# Patient Record
Sex: Female | Born: 1990 | Race: Black or African American | Hispanic: No | Marital: Single | Smoking: Never smoker
Health system: Southern US, Community
[De-identification: ages and names within clinical notes are randomized; demographics above are authoritative.]

## PROBLEM LIST (undated history)

## (undated) DIAGNOSIS — R87619 Unspecified abnormal cytological findings in specimens from cervix uteri: Secondary | ICD-10-CM

## (undated) DIAGNOSIS — R768 Other specified abnormal immunological findings in serum: Secondary | ICD-10-CM

## (undated) DIAGNOSIS — Z8619 Personal history of other infectious and parasitic diseases: Secondary | ICD-10-CM

## (undated) DIAGNOSIS — A749 Chlamydial infection, unspecified: Secondary | ICD-10-CM

## (undated) DIAGNOSIS — Z87898 Personal history of other specified conditions: Secondary | ICD-10-CM

## (undated) DIAGNOSIS — Z86711 Personal history of pulmonary embolism: Secondary | ICD-10-CM

## (undated) DIAGNOSIS — Z86718 Personal history of other venous thrombosis and embolism: Secondary | ICD-10-CM

## (undated) DIAGNOSIS — O0991 Supervision of high risk pregnancy, unspecified, first trimester: Secondary | ICD-10-CM

## (undated) DIAGNOSIS — I2699 Other pulmonary embolism without acute cor pulmonale: Secondary | ICD-10-CM

## (undated) DIAGNOSIS — R87629 Unspecified abnormal cytological findings in specimens from vagina: Secondary | ICD-10-CM

## (undated) DIAGNOSIS — Z8249 Family history of ischemic heart disease and other diseases of the circulatory system: Secondary | ICD-10-CM

## (undated) DIAGNOSIS — IMO0002 Reserved for concepts with insufficient information to code with codable children: Secondary | ICD-10-CM

## (undated) DIAGNOSIS — Z309 Encounter for contraceptive management, unspecified: Secondary | ICD-10-CM

## (undated) HISTORY — DX: Encounter for contraceptive management, unspecified: Z30.9

## (undated) HISTORY — DX: Other pulmonary embolism without acute cor pulmonale: I26.99

## (undated) HISTORY — DX: Personal history of other specified conditions: Z87.898

## (undated) HISTORY — DX: Supervision of high risk pregnancy, unspecified, first trimester: O09.91

## (undated) HISTORY — DX: Personal history of pulmonary embolism: Z86.711

## (undated) HISTORY — DX: Family history of ischemic heart disease and other diseases of the circulatory system: Z82.49

## (undated) HISTORY — DX: Reserved for concepts with insufficient information to code with codable children: IMO0002

## (undated) HISTORY — DX: Unspecified abnormal cytological findings in specimens from vagina: R87.629

## (undated) HISTORY — DX: Personal history of other infectious and parasitic diseases: Z86.19

## (undated) HISTORY — DX: Chlamydial infection, unspecified: A74.9

## (undated) HISTORY — DX: Unspecified abnormal cytological findings in specimens from cervix uteri: R87.619

## (undated) HISTORY — PX: INDUCED ABORTION: SHX677

---
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
## 2011-03-10 LAB — RPR: RPR Ser Ql: NONREACTIVE

## 2011-03-10 LAB — CBC
MCHC: 33.5 g/dL (ref 30.0–36.0)
Platelets: 208 10*3/uL (ref 150–400)
RDW: 15.9 % — ABNORMAL HIGH (ref 11.5–15.5)

## 2011-04-29 ENCOUNTER — Inpatient Hospital Stay (HOSPITAL_COMMUNITY)
Admission: AD | Admit: 2011-04-29 | Discharge: 2011-05-05 | DRG: 175 | Disposition: A | Discharge status: Home / Self Care | Payer: Medicaid Other | Source: Other Acute Inpatient Hospital | Attending: Internal Medicine | Admitting: Internal Medicine

## 2011-04-29 DIAGNOSIS — Z66 Do not resuscitate: Secondary | ICD-10-CM | POA: Diagnosis present

## 2011-04-29 DIAGNOSIS — Z7901 Long term (current) use of anticoagulants: Secondary | ICD-10-CM

## 2011-04-29 DIAGNOSIS — I2699 Other pulmonary embolism without acute cor pulmonale: Principal | ICD-10-CM | POA: Diagnosis present

## 2011-04-29 DIAGNOSIS — R071 Chest pain on breathing: Secondary | ICD-10-CM | POA: Diagnosis present

## 2011-04-29 DIAGNOSIS — Z87891 Personal history of nicotine dependence: Secondary | ICD-10-CM

## 2011-04-29 DIAGNOSIS — Z79899 Other long term (current) drug therapy: Secondary | ICD-10-CM

## 2011-04-29 DIAGNOSIS — R0602 Shortness of breath: Secondary | ICD-10-CM

## 2011-04-29 DIAGNOSIS — J189 Pneumonia, unspecified organism: Secondary | ICD-10-CM | POA: Diagnosis present

## 2011-04-29 DIAGNOSIS — D649 Anemia, unspecified: Secondary | ICD-10-CM | POA: Diagnosis present

## 2011-04-30 LAB — CBC
MCHC: 35.6 g/dL (ref 30.0–36.0)
Platelets: 275 10*3/uL (ref 150–400)
RDW: 13.7 % (ref 11.5–15.5)
WBC: 11.7 10*3/uL — ABNORMAL HIGH (ref 4.0–10.5)

## 2011-04-30 LAB — URINALYSIS, ROUTINE W REFLEX MICROSCOPIC
Bilirubin Urine: NEGATIVE
Glucose, UA: NEGATIVE mg/dL
Ketones, ur: 40 mg/dL — AB
Protein, ur: NEGATIVE mg/dL

## 2011-04-30 LAB — PROTIME-INR: INR: 1.13 (ref 0.00–1.49)

## 2011-04-30 LAB — BASIC METABOLIC PANEL
BUN: 8 mg/dL (ref 6–23)
Calcium: 9.3 mg/dL (ref 8.4–10.5)
Creatinine, Ser: 0.62 mg/dL (ref 0.4–1.2)
GFR calc Af Amer: >60 mL/min (ref >60)

## 2011-04-30 LAB — ANTITHROMBIN III: AntiThromb III Func: 102 % (ref 75–120)

## 2011-04-30 NOTE — H&P (Signed)
[name], [name]               ACCOUNT NO.:  [id number]  MEDICAL RECORD NO.:  [id number]           PATIENT TYPE:  I  LOCATION:  2021                         FACILITY:  [facility]  PHYSICIAN:  Hillery Aldo, M.D.   DATE OF BIRTH:  10-25-1991  DATE OF ADMISSION:  04/29/2011 DATE OF DISCHARGE:                             HISTORY & PHYSICAL   PRIMARY CARE PHYSICIAN:  Mila Homer. Sudie Bailey, MD  CHIEF COMPLAINT:  Back pain, dyspnea.  HISTORY OF PRESENT ILLNESS:  This is a 20 year old female who was admitted to [facility] earlier today with complaints of back pain and dyspnea.  The patient had an elevated D-dimer done on her initial workup and subsequently underwent CT scanning of the chest, which confirmed bilateral pulmonary emboli.  She subsequently was admitted to [facility] and family requested transfer to [facility].  The patient arrived by [facility] this evening and is currently reporting some pleuritic-type back and chest pain.  She is mildly dyspneic, but otherwise reports no presyncope-type symptoms, dizziness, or other symptoms.  She did have an episode of nausea and vomiting earlier today, but this has subsequently resolved.  PAST MEDICAL HISTORY:  None.  PAST SURGICAL HISTORY:  None.  FAMILY HISTORY:  The patient's father is 53 years old and has a history of VTE.  The patient's mother is 74 and healthy.  She has 2 healthy siblings.  SOCIAL HISTORY:  The patient is single and lives alone.  She has a history of social tobacco use, but quit over 5 years ago.  She denies alcohol and drug use.  She works as a Pharmacologist at [employer].  ALLERGIES:  No known drug allergies.  MEDICATIONS:  None.  REVIEW OF SYSTEMS:  CONSTITUTIONAL:  No fever or chills.  Appetite is normal.  HEENT:  No complaints.  CARDIOVASCULAR:  Positive for pleuritic- type chest pain.  No palpitations.  RESPIRATORY:  Positive for dyspnea, but no cough.  GI:  Positive for one episode  of nausea and vomiting.  No diarrhea, melena, or hematochezia.  GU:  No dysuria or hematuria. MUSCULOSKELETAL:  No other complaints.  Comprehensive 14-point review of systems is otherwise unremarkable.  PHYSICAL EXAMINATION:  VITAL SIGNS:  Temperature 97.9, pulse 88, respirations 18, blood pressure 101/68, O2 saturation 99% on 2 L. GENERAL:  Well-developed, well-nourished female in no acute distress. HEENT:  Normocephalic, atraumatic.  PERRL.  EOMI.  Oropharynx is clear. NECK:  Supple, no thyromegaly, no lymphadenopathy, no jugular venous distention. CHEST:  Diminished breath sounds.  Clear bilaterally. HEART:  Regular rate, rhythm.  No murmurs, rubs, or gallops. ABDOMEN:  Soft, nontender, nondistended with normoactive bowel sounds. EXTREMITIES:  No clubbing, edema, or cyanosis. SKIN:  Warm and dry.  No rashes. NEUROLOGIC:  The patient is alert and oriented x3.  Cranial nerves II through XII grossly intact.  Nonfocal.  DATA REVIEW:  D-dimer was elevated at 3.91.  Sodium was 137, potassium 3.6, chloride 106, bicarb 25, BUN 9, creatinine 0.80.  Urinalysis had a large amount of bacteria, but was a contaminated specimen with 5-10 epithelial cells.  Nitrites were positive.  Urine pregnancy testing was  negative.  PT was 13.3 with an INR of 1.0.  PTT was 35.8.  White blood cell count was 10.4, hemoglobin 12.8, hematocrit 38.1, platelets 273.  RADIOGRAPHIC DATA:  Currently not available for my review, but reportedly the patient has had CT scan confirming bilateral pulmonary emboli and lower extremity Dopplers, which were negative for DVT.  ASSESSMENT/PLAN: 1. Acute pulmonary emboli:  Given the patient's positive family     history, I suspect she does have a hypercoagulable disorder.  Since     she has been given heparin, we cannot order a full panel, but we     will check a homocystine, factor V Leiden, and antithrombin III     level.  Protein C and protein S deficiency testing cannot be  done     at this time.  We will admit the patient for 23-hour observation to     initiate Lovenox, Coumadin, and to teach the patient how to self-     inject Lovenox. 2. Bacteriuria:  This was a contaminated specimen.  Since the patient     is asymptomatic, we would not treat this at this time.  She was     given a dose of Rocephin at [facility], but would not     continue this.  Time spent on admission including face-to-face time equals approximately 45 minutes.     Hillery Aldo, M.D.     CR/MEDQ  D:  04/29/2011  T:  04/30/2011  Job:  [id number]  cc:   Mila Homer. Sudie Bailey, M.D.  Electronically Signed by Hillery Aldo M.D. on 04/30/2011 12:06:27 PM

## 2011-05-01 LAB — CBC
HCT: 34.9 % — ABNORMAL LOW (ref 36.0–46.0)
MCH: 27.7 pg (ref 26.0–34.0)
MCHC: 35 g/dL (ref 30.0–36.0)
MCV: 79.1 fL (ref 78.0–100.0)
Platelets: 259 10*3/uL (ref 150–400)
RDW: 13.5 % (ref 11.5–15.5)
WBC: 11.2 10*3/uL — ABNORMAL HIGH (ref 4.0–10.5)

## 2011-05-01 LAB — DIFFERENTIAL
Eosinophils Absolute: 0 10*3/uL (ref 0.0–0.7)
Eosinophils Relative: 0 % (ref 0–5)
Lymphocytes Relative: 20 % (ref 12–46)
Lymphs Abs: 2.2 10*3/uL (ref 0.7–4.0)
Monocytes Absolute: 1.7 10*3/uL — ABNORMAL HIGH (ref 0.1–1.0)

## 2011-05-01 LAB — BASIC METABOLIC PANEL
BUN: 8 mg/dL (ref 6–23)
CO2: 30 mEq/L (ref 19–32)
Calcium: 9.6 mg/dL (ref 8.4–10.5)
Chloride: 98 mEq/L (ref 96–112)
Creatinine, Ser: 0.65 mg/dL (ref 0.4–1.2)
GFR calc Af Amer: >60 mL/min (ref >60)
Glucose, Bld: 98 mg/dL (ref 70–99)

## 2011-05-02 ENCOUNTER — Inpatient Hospital Stay (HOSPITAL_COMMUNITY): Payer: Medicaid Other

## 2011-05-02 ENCOUNTER — Encounter (HOSPITAL_COMMUNITY): Payer: Self-pay | Admitting: Radiology

## 2011-05-02 LAB — PROTIME-INR: Prothrombin Time: 19.7 seconds — ABNORMAL HIGH (ref 11.6–15.2)

## 2011-05-02 LAB — CBC
MCH: 27.2 pg (ref 26.0–34.0)
MCHC: 34.5 g/dL (ref 30.0–36.0)
MCV: 78.8 fL (ref 78.0–100.0)
Platelets: 282 10*3/uL (ref 150–400)
RBC: 4.3 MIL/uL (ref 3.87–5.11)
RDW: 13.3 % (ref 11.5–15.5)

## 2011-05-02 LAB — BASIC METABOLIC PANEL
BUN: 9 mg/dL (ref 6–23)
CO2: 31 mEq/L (ref 19–32)
Calcium: 9.3 mg/dL (ref 8.4–10.5)
GFR calc non Af Amer: >60 mL/min (ref >60)
Glucose, Bld: 104 mg/dL — ABNORMAL HIGH (ref 70–99)
Sodium: 135 mEq/L (ref 135–145)

## 2011-05-02 MED ORDER — IOHEXOL 300 MG/ML  SOLN: 100.0000 mL | Freq: Once | INTRAMUSCULAR | Status: AC | PRN

## 2011-05-03 LAB — CBC
Hemoglobin: 11.5 g/dL — ABNORMAL LOW (ref 12.0–15.0)
MCH: 27.3 pg (ref 26.0–34.0)
MCHC: 34.6 g/dL (ref 30.0–36.0)
RDW: 13.4 % (ref 11.5–15.5)

## 2011-05-03 LAB — PROTIME-INR: Prothrombin Time: 24.9 seconds — ABNORMAL HIGH (ref 11.6–15.2)

## 2011-05-04 ENCOUNTER — Inpatient Hospital Stay (HOSPITAL_COMMUNITY): Payer: Medicaid Other

## 2011-05-04 LAB — CBC
HCT: 32.6 % — ABNORMAL LOW (ref 36.0–46.0)
Hemoglobin: 11.2 g/dL — ABNORMAL LOW (ref 12.0–15.0)
MCH: 27.1 pg (ref 26.0–34.0)
MCHC: 34.4 g/dL (ref 30.0–36.0)
RDW: 13.6 % (ref 11.5–15.5)

## 2011-05-04 LAB — PROTIME-INR: Prothrombin Time: 31.7 seconds — ABNORMAL HIGH (ref 11.6–15.2)

## 2011-05-04 LAB — BASIC METABOLIC PANEL
BUN: 6 mg/dL (ref 6–23)
CO2: 29 mEq/L (ref 19–32)
GFR calc non Af Amer: >60 mL/min (ref >60)
Glucose, Bld: 82 mg/dL (ref 70–99)
Potassium: 3.5 mEq/L (ref 3.5–5.1)

## 2011-05-06 LAB — FACTOR 5 LEIDEN

## 2011-05-18 NOTE — Discharge Summary (Signed)
[name], [name]               ACCOUNT NO.:  [id number]  MEDICAL RECORD NO.:  [id number]           PATIENT TYPE:  I  LOCATION:  2021                         FACILITY:  [facility]  PHYSICIAN:  Lonia Blood, M.D.       DATE OF BIRTH:  [birth year]  DATE OF ADMISSION:  04/29/2011 DATE OF DISCHARGE:  05/05/2011                              DISCHARGE SUMMARY   PRIMARY CARE PHYSICIAN:  Mila Homer. Sudie Bailey, MD  DISCHARGE DIAGNOSES: 1. Bilateral pulmonary emboli with right lower lobe pulmonary     infarction with mild parapneumonic effusion. 2. Mild anemia. 3. DNR/DNI. 4. Probable pneumonia.  DISCHARGE MEDICATIONS: 1. Coumadin 5 mg daily to be further adjusted by the primary care     physician when she visits the office on May 06, 2011. 2. Colace 100 mg twice daily for constipation. 3. Oxycodone 5 mg every 4 hours needed for severe pain. 4. Ibuprofen 400 mg three times a day for 5 more days to be titrated     down based on symptoms by the primary care physician. 5. Avelox 400 grams daily for 3 more days.  CONDITION ON DISCHARGE:  [name] was discharged in fair condition. She was improved.  She was still having some mild to moderate residual pleuritic chest pain.  She was not hypoxic.  Her oxygen saturation was 96% on room air.  She was instructed about energy conservation minimal activity.  She received education about the Coumadin diet, and she is going to follow up with Dr. John Giovanni on May 06, 2011, at 1:00 p.m. for PT/INR checks and hospital followup visit.  PROCEDURE THIS ADMISSION: 1. The patient underwent a chest CT with contrast May 02, 2011,     showing right lower lobe infiltrate/pneumonia with parapneumonic     effusion, pleural parenchymal thickening of the left lung base     small infarct atelectasis. 2. May 04, 2011 stable right lower lobe infiltrate with a small     parapneumonic effusion.  CONSULTATION:  No consultations obtained.  HISTORY AND PHYSICAL:   Refer to dictated H and P done by Dr. Hillery Aldo.  Briefly, [name] is a 20 year old woman without any significant past medical history presented to the [facility] where she was found to have bilateral pulmonary emboli. She was transferred to [facility] for further workup and observation.  At [facility], [name] was started on subcutaneous Lovenox and oral Coumadin.  She also received intravenous fluids.  She was never hypoxic.  By hospital #2, she was complaining bitterly of right-sided chest pain.  At that point in time, we ordered a chest x-ray which confirmed presence of right lower lobe infiltrate with adjacent effusion.  She underwent a chest CT which did not show any hemothorax in fact showed a consolidation of right lower lobe which was probably lung infarct and superimposed pneumonia with a small parapneumonic effusion.  The patient was continued on Lovenox and Coumadin and Avelox intravenously was added.  Slowly her chest pain improved her fever resolved, and she gradually got better.  By May 05, 2011, she already  had 5 days of overlapping treatment between Lovenox and Coumadin.  Her INR was therapeutic at 2.9 for 2 consecutive days so Lovenox was discontinued and she was discharge on oral Coumadin.  Our plan of care for the outpatient setting is to continue antibiotics for 3 more days, nonsteroidal inflammatory drugs as long as she has pleuritic chest pain and then anticoagulate with Coumadin for probably 2 years and then discontinue with careful plans to monitor for recurrence of the pulmonary emboli.     Lonia Blood, M.D.     SL/MEDQ  D:  05/06/2011  T:  05/06/2011  Job:  [id number]  cc:   Mila Homer. Sudie Bailey, M.D.  Electronically Signed by Lonia Blood M.D. on 05/18/2011 09:37:05 AM

## 2011-06-08 ENCOUNTER — Encounter (HOSPITAL_COMMUNITY): Payer: Medicaid Other | Attending: Oncology | Admitting: Oncology

## 2011-06-08 DIAGNOSIS — I2699 Other pulmonary embolism without acute cor pulmonale: Secondary | ICD-10-CM

## 2012-02-29 ENCOUNTER — Emergency Department (INDEPENDENT_AMBULATORY_CARE_PROVIDER_SITE_OTHER): Payer: Medicaid Other

## 2012-02-29 ENCOUNTER — Encounter (HOSPITAL_BASED_OUTPATIENT_CLINIC_OR_DEPARTMENT_OTHER): Payer: Self-pay | Admitting: *Deleted

## 2012-02-29 ENCOUNTER — Emergency Department (HOSPITAL_BASED_OUTPATIENT_CLINIC_OR_DEPARTMENT_OTHER)
Admission: EM | Admit: 2012-02-29 | Discharge: 2012-02-29 | Disposition: A | Discharge status: Home Health | Payer: Medicaid Other | Attending: Emergency Medicine | Admitting: Emergency Medicine

## 2012-02-29 ENCOUNTER — Other Ambulatory Visit: Payer: Self-pay

## 2012-02-29 DIAGNOSIS — Z331 Pregnant state, incidental: Secondary | ICD-10-CM | POA: Insufficient documentation

## 2012-02-29 DIAGNOSIS — M545 Low back pain, unspecified: Secondary | ICD-10-CM | POA: Insufficient documentation

## 2012-02-29 DIAGNOSIS — R079 Chest pain, unspecified: Secondary | ICD-10-CM

## 2012-02-29 DIAGNOSIS — R109 Unspecified abdominal pain: Secondary | ICD-10-CM | POA: Insufficient documentation

## 2012-02-29 DIAGNOSIS — Z86718 Personal history of other venous thrombosis and embolism: Secondary | ICD-10-CM | POA: Insufficient documentation

## 2012-02-29 DIAGNOSIS — Z86711 Personal history of pulmonary embolism: Secondary | ICD-10-CM

## 2012-02-29 HISTORY — DX: Personal history of other venous thrombosis and embolism: Z86.718

## 2012-02-29 LAB — BASIC METABOLIC PANEL
CO2: 24 mEq/L (ref 19–32)
Chloride: 104 mEq/L (ref 96–112)
Creatinine, Ser: 0.6 mg/dL (ref 0.50–1.10)
GFR calc Af Amer: >90 mL/min (ref >90)
Potassium: 3.7 mEq/L (ref 3.5–5.1)
Sodium: 136 mEq/L (ref 135–145)

## 2012-02-29 LAB — CBC
HCT: 33.3 % — ABNORMAL LOW (ref 36.0–46.0)
MCHC: 36 g/dL (ref 30.0–36.0)
Platelets: 287 10*3/uL (ref 150–400)
RDW: 13.7 % (ref 11.5–15.5)
WBC: 10.1 10*3/uL (ref 4.0–10.5)

## 2012-02-29 LAB — HCG, QUANTITATIVE, PREGNANCY: hCG, Beta Chain, Quant, S: 165879 m[IU]/mL — ABNORMAL HIGH (ref <5)

## 2012-02-29 LAB — PROTIME-INR
INR: 1.08 (ref 0.00–1.49)
Prothrombin Time: 14.2 seconds (ref 11.6–15.2)

## 2012-02-29 LAB — PREGNANCY, URINE: Preg Test, Ur: POSITIVE — AB

## 2012-02-29 LAB — DIFFERENTIAL
Basophils Absolute: 0 10*3/uL (ref 0.0–0.1)
Basophils Relative: 0 % (ref 0–1)
Lymphocytes Relative: 19 % (ref 12–46)
Neutro Abs: 7.2 10*3/uL (ref 1.7–7.7)
Neutrophils Relative %: 71 % (ref 43–77)

## 2012-02-29 MED ORDER — IOHEXOL 350 MG/ML SOLN
80.0000 mL | Freq: Once | INTRAVENOUS | Status: AC | PRN
  Administered 2012-02-29: 80 mL via INTRAVENOUS

## 2012-02-29 NOTE — ED Notes (Signed)
Chest pain x 2 days back pain today. Hx of blood clots in her lungs about a year ago. Denies sob.

## 2012-02-29 NOTE — ED Provider Notes (Signed)
History  This chart was scribed for Tracy Shi, MD by Bennett Scrape. This patient was seen in room MH02/MH02 and the patient's care was started at 3:37PM.  CSN: [id number]  Arrival date & time 02/29/12  1505   First MD Initiated Contact with Patient 02/29/12 1531      Chief Complaint  Patient presents with  . Chest Pain    The history is provided by the patient. No language interpreter was used.    [name] is a 21 y.o. female with a h/o blood clots who presents to the Emergency Department complaining of 2 days of gradual onset, non-changing, constant chest pain and 6 hours of gradual onset, gradually worsening, consatnt right flank and lower back pain. The chest pain is described as a dull ache and soreness feeling. The right flank and lower back pain is described as a sharp pain that is worse with deep breathes. Pt has not taken any medication PTA to improve symptoms. She reports having diagnosed PEs last April/May in both lungs and wanted to be evaluated today to make sure that it wasn't another blood clot. The symptoms are similar to the symptoms she experienced last year. Since a cause was not determined last year, she states that she was told to take Coumadin for one year and then the proper genetic tests would be run to determine why she was having blood clots. She denies being on birth control as the cause of the blood clots. She reports a family h/o blood clots with her father. She states that she has been taking coumadin everyday but not within the same time frame everyday. She also states that she has not taken her coumadin for the past 2 days due to her new job schedule. She has not taken it today yet. She denies hemoptysis, SOB, nausea, emesis, cough, fever, congestion, HA and leg swelling as associated symptoms. She has no other h/o chronic medical problems. She denies smoking and alcohol use.  Past Medical History  Diagnosis Date  . Hx of blood clots     History  reviewed. No pertinent past surgical history.  Family History: Hx of blood clots with Father  History  Substance Use Topics  . Smoking status: Never Smoker   . Smokeless tobacco: Not on file  . Alcohol Use: No    Review of Systems  A complete 10 system review of systems was obtained and is otherwise negative except as noted in the HPI.   Allergies  Review of patient's allergies indicates no known allergies.  Home Medications   Current Outpatient Rx  Name Route Sig Dispense Refill  . WARFARIN SODIUM 5 MG PO TABS Oral Take 5 mg by mouth daily. Patient takes 1.5 mg of this medication daily.      Triage Vitals: BP 123/86  Pulse 76  Temp(Src) 97.4 F (36.3 C) (Oral)  Resp 16  SpO2 98%  LMP 01/01/2012  Physical Exam  Nursing note and vitals reviewed. Constitutional: She is oriented to person, place, and time. She appears well-developed and well-nourished. No distress.  HENT:  Head: Normocephalic and atraumatic.  Eyes: Conjunctivae and EOM are normal. Pupils are equal, round, and reactive to light.  Neck: Normal range of motion. Neck supple.  Cardiovascular: Normal rate, regular rhythm and intact distal pulses.         Date: 02/29/2012  Rate: 77  Rhythm: normal sinus rhythm with PACs  QRS Axis: normal  Intervals: normal  ST/T Wave abnormalities: normal  Conduction  Disutrbances: none  Narrative Interpretation: unremarkable      Pulmonary/Chest: Effort normal and breath sounds normal. No respiratory distress.  Abdominal: Soft. Normal appearance. She exhibits no distension.  Musculoskeletal: Normal range of motion. She exhibits tenderness (Mild right flank and lower back tenderness). She exhibits no edema.  Neurological: She is alert and oriented to person, place, and time. No cranial nerve deficit.  Skin: Skin is warm and dry. No rash noted.  Psychiatric: She has a normal mood and affect. Her behavior is normal.    ED Course  Procedures (including critical care  time) Laceration of the pelvis with an ultrasound showed an intrauterine pregnancy with a fetal pole probably 5 or [redacted] weeks gestational age with some cardiac activity.  The patient stated that she was 99.9% sure that she was not going to keep the pregnancy and would terminate it.  A lot of this fact I will proceed as if the patient was not pregnant I discussed this with her along with the complications of both radiation and anticoagulation to the developing fetus and the patient was agreeable to that treatment plan because she said she was not going to keep the pregnancy. DIAGNOSTIC STUDIES: Oxygen Saturation is 98% on room air, normal by my interpretation.    COORDINATION OF CARE: 3:40PM-Informed pt of positive pregnancy test and pt acknowledged results. Discussed treatment plan with pt and pt agreed to plan.  Labs Reviewed  PREGNANCY, URINE - Abnormal; Notable for the following:    Preg Test, Ur POSITIVE (*)    All other components within normal limits  HCG, QUANTITATIVE, PREGNANCY - Abnormal; Notable for the following:    hCG, Beta Chain, [place]alia Longest [id number] (*)    All other components within normal limits  CBC - Abnormal; Notable for the following:    HCT 33.3 (*)    MCV 77.6 (*)    All other components within normal limits  PROTIME-INR  DIFFERENTIAL  BASIC METABOLIC PANEL    Ct Angio Chest W/cm &/or Wo Cm  02/29/2012  *RADIOLOGY REPORT*  Clinical Data: 21 year old female with chest pain.  History of pulmonary emboli.  CT ANGIOGRAPHY CHEST  Technique:  Multidetector CT imaging of the chest using the standard protocol during bolus administration of intravenous contrast. Multiplanar reconstructed images including MIPs were obtained and reviewed to evaluate the vascular anatomy. The patient's abdomen was shielded given the patient's positive pregnancy test.  Contrast: 80mL OMNIPAQUE IOHEXOL 350 MG/ML IV SOLN  Comparison: 05/02/2011 CT  Findings: This is a technically satisfactory study.  No  pulmonary emboli are identified. There is no evidence of thoracic aortic aneurysm or definite dissection.  The heart and great vessels are within normal limits.  Minimal right basilar scarring is noted. The lungs are otherwise clear. There is no evidence of airspace disease, consolidation, nodules/mass or endobronchial/endotracheal lesion.  No acute or suspicious bony abnormalities are identified. Portions of the visualized upper abdomen are unremarkable.  IMPRESSION: No evidence of acute abnormality - no evidence of pulmonary emboli or aortic aneurysm/definite dissection.  Mild right basilar scarring.  Original Report Authenticated By: Rosendo Gros, M.D.     1. Chest pain   2. IUP (intrauterine pregnancy), incidental       MDM  I discussed the findings of the CT scan with the patient.  The patient is going to terminate the pregnancy and has decided not to start back anticoagulants until termination is complete.  After that she will contact her hematologist and discuss further workup  for determination of long-term anticoagulant therapy.      I personally performed the services described in this documentation, which was scribed in my presence. The recorded information has been reviewed and considered.    Tracy Shi, MD 02/29/12 [phone]

## 2012-02-29 NOTE — ED Notes (Signed)
MD at bedside for bedside ultrasound.

## 2012-02-29 NOTE — Discharge Instructions (Signed)
Chest Pain (Nonspecific) It is often hard to give a specific diagnosis for the cause of chest pain. There is always a chance that your pain could be related to something serious, such as a heart attack or a blood clot in the lungs. You need to follow up with your caregiver for further evaluation. CAUSES   Heartburn.   Pneumonia or bronchitis.   Anxiety or stress.   Inflammation around your heart (pericarditis) or lung (pleuritis or pleurisy).   A blood clot in the lung.   A collapsed lung (pneumothorax). It can develop suddenly on its own (spontaneous pneumothorax) or from injury (trauma) to the chest.   Shingles infection (herpes zoster virus).  The chest wall is composed of bones, muscles, and cartilage. Any of these can be the source of the pain.  The bones can be bruised by injury.   The muscles or cartilage can be strained by coughing or overwork.   The cartilage can be affected by inflammation and become sore (costochondritis).  DIAGNOSIS  Lab tests or other studies, such as X-rays, electrocardiography, stress testing, or cardiac imaging, may be needed to find the cause of your pain.  TREATMENT   Treatment depends on what may be causing your chest pain. Treatment may include:   Acid blockers for heartburn.   Anti-inflammatory medicine.   Pain medicine for inflammatory conditions.   Antibiotics if an infection is present.   You may be advised to change lifestyle habits. This includes stopping smoking and avoiding alcohol, caffeine, and chocolate.   You may be advised to keep your head raised (elevated) when sleeping. This reduces the chance of acid going backward from your stomach into your esophagus.   Most of the time, nonspecific chest pain will improve within 2 to 3 days with rest and mild pain medicine.  HOME CARE INSTRUCTIONS   If antibiotics were prescribed, take your antibiotics as directed. Finish them even if you start to feel better.   For the next few  days, avoid physical activities that bring on chest pain. Continue physical activities as directed.   Do not smoke.   Avoid drinking alcohol.   Only take over-the-counter or prescription medicine for pain, discomfort, or fever as directed by your caregiver.   Follow your caregiver's suggestions for further testing if your chest pain does not go away.   Keep any follow-up appointments you made. If you do not go to an appointment, you could develop lasting (chronic) problems with pain. If there is any problem keeping an appointment, you must call to reschedule.  SEEK MEDICAL CARE IF:   You think you are having problems from the medicine you are taking. Read your medicine instructions carefully.   Your chest pain does not go away, even after treatment.   You develop a rash with blisters on your chest.  SEEK IMMEDIATE MEDICAL CARE IF:   You have increased chest pain or pain that spreads to your arm, neck, jaw, back, or abdomen.   You develop shortness of breath, an increasing cough, or you are coughing up blood.   You have severe back or abdominal pain, feel nauseous, or vomit.   You develop severe weakness, fainting, or chills.   You have a fever.  THIS IS AN EMERGENCY. Do not wait to see if the pain will go away. Get medical help at once. Call your local emergency services (911 in [place] Do not drive yourself to the hospital. MAKE SURE YOU:   Understand these instructions.     Will watch your condition.   Will get help right away if you are not doing well or get worse.  Document Released: 09/02/2005 Document Revised: 11/12/2011 Document Reviewed: 06/28/2008 [employer] Patient Information 2012 [employer]. 

## 2012-02-29 NOTE — ED Notes (Signed)
MD at bedside. 

## 2012-05-30 ENCOUNTER — Ambulatory Visit (HOSPITAL_COMMUNITY): Payer: Medicaid Other | Admitting: Oncology

## 2012-06-14 ENCOUNTER — Ambulatory Visit (HOSPITAL_COMMUNITY): Payer: Medicaid Other | Admitting: Oncology

## 2012-08-09 ENCOUNTER — Encounter (HOSPITAL_COMMUNITY): Payer: Self-pay | Admitting: Oncology

## 2012-08-09 ENCOUNTER — Encounter (HOSPITAL_COMMUNITY): Payer: Medicaid Other | Attending: Oncology | Admitting: Oncology

## 2012-08-09 VITALS — BP 109/65 | HR 94 | Temp 98.6°F | Resp 16

## 2012-08-09 DIAGNOSIS — I2699 Other pulmonary embolism without acute cor pulmonale: Secondary | ICD-10-CM

## 2012-08-09 DIAGNOSIS — Z09 Encounter for follow-up examination after completed treatment for conditions other than malignant neoplasm: Secondary | ICD-10-CM | POA: Insufficient documentation

## 2012-08-09 DIAGNOSIS — Z86711 Personal history of pulmonary embolism: Secondary | ICD-10-CM | POA: Insufficient documentation

## 2012-08-09 NOTE — Patient Instructions (Addendum)
[name]  DOB September 15, 1991 CSN [id number]  MRN [id number] Dr. Glenford Peers   [facility]  Discharge Instructions  RECOMMENDATIONS MADE BY THE CONSULTANT AND ANY TEST RESULTS WILL BE SENT TO YOUR REFERRING DOCTOR.   EXAM FINDINGS BY MD TODAY AND SIGNS AND SYMPTOMS TO REPORT TO CLINIC OR PRIMARY MD: you are doing well.  We will check a hypercoagulable panel today.  Call us in 2 weeks for results.  MEDICATIONS PRESCRIBED: none   INSTRUCTIONS GIVEN AND DISCUSSED: Other :  If you have sudden chest pain, shortness of breath, swelling of extremity - go to nearest ED then let us know.  SPECIAL INSTRUCTIONS/FOLLOW-UP: Lab work Needed today and Return to Clinic in 6 months.   I acknowledge that I have been informed and understand all the instructions given to me and received a copy. I do not have any more questions at this time, but understand that I may call the Specialty Clinic at [facility] at [phone] during business hours should I have any further questions or need assistance in obtaining follow-up care.    __________________________________________  _____________  __________ Signature of Patient or Authorized Representative            Date                   Time    __________________________________________ Nurse's Signature

## 2012-08-09 NOTE — Progress Notes (Signed)
Problem #1 pulmonary embolus in May 2012 with infarction of the right lower lobe and with pains in her lower legs presumably the site of origin of the blood clots. Her only risk factor that we could come up with was the use of the Mirena IUD. She however had one family member namely her father who had a pulmonary embolus and is being treated by my associate Dr. Myna Hidalgo. Her father  did not have a clear-cut predisposing factor. She has been doing very well, asymptomatic, and is here today for routine followup. She finished her 12 full months of Coumadin anticoagulation. We therefore we will do a hypercoagulable panel in its entirety. And she will call us back for the results. She would like to be on something for birth control the signs condoms and other measures. She does however want to have more children someday in the future.  I want to see her back in 6 months either way.

## 2012-08-09 NOTE — Progress Notes (Signed)
[name] presented for labwork. Labs per MD order drawn via Peripheral Line 23 gauge needle inserted in right AC  Good blood return present. Procedure without incident.  Needle removed intact. Patient tolerated procedure well.   

## 2012-08-10 LAB — PROTEIN C ACTIVITY: Protein C Activity: 104 % (ref 75–133)

## 2012-08-10 LAB — PROTEIN S ACTIVITY: Protein S Activity: 41 % — ABNORMAL LOW (ref 69–129)

## 2012-08-10 LAB — LUPUS ANTICOAGULANT PANEL
DRVVT: 42.5 secs (ref <45.1)
Lupus Anticoagulant: NOT DETECTED
PTT Lupus Anticoagulant: 37.6 secs (ref 28.0–43.0)

## 2012-08-10 LAB — PROTEIN S, TOTAL: Protein S Ag, Total: 67 % (ref 60–150)

## 2012-08-10 LAB — ANTITHROMBIN III: AntiThromb III Func: 77 % (ref 75–120)

## 2012-08-10 LAB — HOMOCYSTEINE: Homocysteine: 4.1 umol/L (ref 4.0–15.4)

## 2012-08-10 LAB — PROTEIN C, TOTAL: Protein C, Total: 56 % — ABNORMAL LOW (ref 72–160)

## 2012-08-11 LAB — BETA-2-GLYCOPROTEIN I ABS, IGG/M/A
Beta-2 Glyco I IgG: 0 G Units (ref <20)
Beta-2-Glycoprotein I IgA: 4 A Units (ref <20)

## 2012-08-11 LAB — CARDIOLIPIN ANTIBODIES, IGG, IGM, IGA: Anticardiolipin IgG: 6 GPL U/mL — ABNORMAL LOW (ref <23)

## 2012-08-12 LAB — PROTHROMBIN GENE MUTATION

## 2012-08-22 ENCOUNTER — Telehealth (HOSPITAL_COMMUNITY): Payer: Self-pay | Admitting: *Deleted

## 2012-08-22 NOTE — Telephone Encounter (Signed)
Pt called requesting lab reports. Please advise as to what to tell her.

## 2012-08-30 ENCOUNTER — Telehealth (HOSPITAL_COMMUNITY): Payer: Self-pay | Admitting: *Deleted

## 2012-08-30 NOTE — Telephone Encounter (Signed)
I talked to [facility] and let her know that from our standpoint she can resume her IUD/birth control. I explained to her that factor v and  PROTHROMBIN II GENE MUTATION were negative. Protein s and protein c were a little low. She is agreeable to have referral made to [facility].

## 2012-09-02 ENCOUNTER — Telehealth (HOSPITAL_COMMUNITY): Payer: Self-pay | Admitting: *Deleted

## 2012-09-02 NOTE — Telephone Encounter (Signed)
Yes, Dr Isaiah Serge or Dr MA ----- Message -----    From: Adelene Amas, RN    Sent: 08/30/2012   4:23 PM      To: Randall An, MD  I talked to [facility] and let her know that from our standpoint she can resume her IUD/birth control. I explained to her that factor v and  PROTHROMBIN II GENE MUTATION were negative. Protein s and protein c were a little low. She is agreeable to have referral made to [facility]. Do you want [place] to proceed with this?  ----- Message -----    From: Adelene Amas, RN    Sent: 08/30/2012  12:16 PM      To: Adelene Amas, RN  Call pt. OK for IUD/birth control.  Would she consider consult at [facility] ---Dr. Isaiah Serge

## 2013-02-06 ENCOUNTER — Encounter (HOSPITAL_COMMUNITY): Payer: Medicaid Other | Attending: Oncology | Admitting: Oncology

## 2013-02-06 ENCOUNTER — Encounter (HOSPITAL_COMMUNITY): Payer: Self-pay | Admitting: Oncology

## 2013-02-06 VITALS — BP 120/70 | HR 63 | Temp 98.1°F | Resp 18 | Wt 175.8 lb

## 2013-02-06 DIAGNOSIS — I2699 Other pulmonary embolism without acute cor pulmonale: Secondary | ICD-10-CM

## 2013-02-06 NOTE — Progress Notes (Signed)
#  1 pulmonary embolus in May 2012 with infarction of the right lower lobe as well as pain in her lower legs presumably the site of origin. She was treated for 12 months with anticoagulation. There was a family history her father a pulmonary embolus him. There no other clearcut predisposing factors. Therefore this was unprovoked PE. Hypercoagulable panel is negative.  I referred her to Dr. Isaiah Serge at [facility]. His note is on its way been included in the EMR. We are requesting another copy. She was under the impression that we were to initiate and coagulation once again but for an unknown specified time.  We will get that note be back in touch with her as soon as we get it. Otherwise we'll tentatively put her down see [place] in 6 months. The one thing I do remember is that he was not at all concerned about the Mirena IUD device as a causative agent.  I have asked her to call us one week from today if she does not hear from [place] before then.

## 2013-02-06 NOTE — Patient Instructions (Addendum)
[facility] Discharge Instructions  RECOMMENDATIONS MADE BY THE CONSULTANT AND ANY TEST RESULTS WILL BE SENT TO YOUR REFERRING PHYSICIAN.  EXAM FINDINGS BY THE PHYSICIAN TODAY AND SIGNS OR SYMPTOMS TO REPORT TO CLINIC OR PRIMARY PHYSICIAN: Discussion by MD regarding xarelto versus coumadin..  We need to get the note from [facility].  Call us on Monday if you don't hear from [place].  MEDICATIONS PRESCRIBED:  none  INSTRUCTIONS GIVEN AND DISCUSSED: Report swelling of extremities, sudden chest pain or shortness of breath (after going to ED).  SPECIAL INSTRUCTIONS/FOLLOW-UP: Follow-up in 6 months  Thank you for choosing [facility] to provide your oncology and hematology care.  To afford each patient quality time with our providers, please arrive at least 15 minutes before your scheduled appointment time.  With your help, our goal is to use those 15 minutes to complete the necessary work-up to ensure our physicians have the information they need to help with your evaluation and healthcare recommendations.    Effective January 1st, 2014, we ask that you re-schedule your appointment with our physicians should you arrive 10 or more minutes late for your appointment.  We strive to give you quality time with our providers, and arriving late affects you and other patients whose appointments are after yours.    Again, thank you for choosing [facility].  Our hope is that these requests will decrease the amount of time that you wait before being seen by our physicians.       _____________________________________________________________  Should you have questions after your visit to [facility], please contact our office at [phone] between the hours of 8:30 a.m. and 5:00 p.m.  Voicemails left after 4:30 p.m. will not be returned until the following business day.  For prescription refill requests, have your pharmacy contact our office with  your prescription refill request.

## 2013-02-08 ENCOUNTER — Encounter (HOSPITAL_COMMUNITY): Payer: Self-pay

## 2013-02-08 ENCOUNTER — Encounter (HOSPITAL_COMMUNITY): Payer: Self-pay | Admitting: Oncology

## 2013-02-08 ENCOUNTER — Telehealth (HOSPITAL_COMMUNITY): Payer: Self-pay | Admitting: Oncology

## 2013-02-08 DIAGNOSIS — Z8249 Family history of ischemic heart disease and other diseases of the circulatory system: Secondary | ICD-10-CM | POA: Insufficient documentation

## 2013-02-08 DIAGNOSIS — I2699 Other pulmonary embolism without acute cor pulmonale: Secondary | ICD-10-CM

## 2013-02-08 DIAGNOSIS — Z86711 Personal history of pulmonary embolism: Secondary | ICD-10-CM | POA: Insufficient documentation

## 2013-02-08 HISTORY — DX: Other pulmonary embolism without acute cor pulmonale: I26.99

## 2013-02-08 HISTORY — DX: Family history of ischemic heart disease and other diseases of the circulatory system: Z82.49

## 2013-02-08 NOTE — Telephone Encounter (Signed)
The patient a voice message on her telephone requesting a telephone call back to the [facility] cancer Center.  We were able to ascertain a note from the [facility] coagulation clinic which recommended Xarelto.  I would like for the patient to call us back so I can provide her with some education regarding the medication.  I received a telephone call back, I will happily prescribe the medication. Xarelto 20 mg daily.  I will await a return telephone call.  KEFALAS,THOMAS

## 2013-03-09 ENCOUNTER — Other Ambulatory Visit (HOSPITAL_COMMUNITY): Payer: Self-pay | Admitting: *Deleted

## 2013-03-09 DIAGNOSIS — I2699 Other pulmonary embolism without acute cor pulmonale: Secondary | ICD-10-CM

## 2013-03-10 ENCOUNTER — Encounter (HOSPITAL_COMMUNITY): Payer: Medicaid Other | Attending: Oncology

## 2013-03-10 DIAGNOSIS — I2699 Other pulmonary embolism without acute cor pulmonale: Secondary | ICD-10-CM | POA: Insufficient documentation

## 2013-03-10 LAB — D-DIMER, QUANTITATIVE: D-Dimer, Quant: 1.23 ug/mL-FEU — ABNORMAL HIGH (ref 0.00–0.48)

## 2013-03-10 NOTE — Progress Notes (Signed)
Labs drawn today for d;dimer,protein s

## 2013-03-13 ENCOUNTER — Other Ambulatory Visit (HOSPITAL_COMMUNITY): Payer: Self-pay

## 2013-03-13 LAB — PROTEIN S ACTIVITY: Protein S Activity: 97 % (ref 69–129)

## 2013-03-14 ENCOUNTER — Encounter (HOSPITAL_COMMUNITY): Payer: Self-pay

## 2013-03-15 ENCOUNTER — Encounter (HOSPITAL_COMMUNITY): Payer: Medicaid Other

## 2013-03-15 NOTE — Progress Notes (Signed)
Labs drawn today for Protein S free

## 2013-03-22 ENCOUNTER — Encounter: Payer: Self-pay | Admitting: *Deleted

## 2013-03-22 DIAGNOSIS — B009 Herpesviral infection, unspecified: Secondary | ICD-10-CM | POA: Insufficient documentation

## 2013-03-22 DIAGNOSIS — A749 Chlamydial infection, unspecified: Secondary | ICD-10-CM | POA: Insufficient documentation

## 2013-03-23 ENCOUNTER — Ambulatory Visit (INDEPENDENT_AMBULATORY_CARE_PROVIDER_SITE_OTHER): Payer: Medicaid Other | Admitting: Obstetrics and Gynecology

## 2013-03-23 ENCOUNTER — Encounter: Payer: Self-pay | Admitting: Obstetrics and Gynecology

## 2013-03-23 VITALS — BP 122/82 | Ht 66.0 in | Wt 172.0 lb

## 2013-03-23 DIAGNOSIS — N898 Other specified noninflammatory disorders of vagina: Secondary | ICD-10-CM

## 2013-03-23 DIAGNOSIS — IMO0002 Reserved for concepts with insufficient information to code with codable children: Secondary | ICD-10-CM | POA: Insufficient documentation

## 2013-03-23 DIAGNOSIS — I2699 Other pulmonary embolism without acute cor pulmonale: Secondary | ICD-10-CM

## 2013-03-23 LAB — POCT WET PREP WITH KOH
Clue Cells Wet Prep HPF POC: NEGATIVE
Trichomonas, UA: NEGATIVE

## 2013-03-23 NOTE — Patient Instructions (Signed)
Please review patient visual guides with partner.Dyspareunia Dyspareunia is pain during sexual intercourse. It is most common in women, but it also happens in men.  CAUSES  Female The pain from this condition is usually felt when anything is put into the vagina, but any part of the genitals may cause pain during sex. Even sitting or wearing pants can cause pain. Sometimes, a cause cannot be found. Some causes of pain during intercourse are:  Infections of the skin around the vagina.  Vaginal infections, such as a yeast, bacterial, or viral infection.  Vaginismus. This is the inability to have anything put in the vagina even when the woman wants it to happen. There is an automatic muscle contraction and pain. The pain of the muscle contraction can be so severe that intercourse is impossible.  Allergic reaction from spermicides, semen, condoms, scented tampons, soaps, douches, and vaginal sprays.  A fluid-filled sac (cyst) on the Bartholin or Skene glands, located at the opening of the vagina.  Scar tissue in the vagina from a surgically enlarged opening (episiotomy) or tearing after delivering a baby.  Vaginal dryness. This is more common in menopause. The normal secretions of the vagina are decreased. Changes in estrogen levels and increased difficulty becoming aroused can cause painful sex. Vaginal dryness can also happen when taking birth control pills.  Thinning of the tissue (atrophy) of the vulva and vagina. This makes the area thinner, smaller, unable to stretch to accommodate a penis, and prone to infection and tearing.  Vulvar vestibulitis or vestibulodynia.This is a condition that causes pain involving the area around the entrance to the vagina.The most common cause in young women is birth control pills.Women with low estrogen levels (postmenopausal women) may also experience this.Other causes include allergic reactions, too many nerve endings, skin conditions, and pelvic muscles  that cannot relax.  Vulvar dermatoses. This includes skin conditions such as lichen sclerosus and lichen planus.  Lack of foreplay to lubricate the vagina. This can cause vaginal dryness.  Noncancerous tumors (fibroids) in the uterus.  Uterus lining tissue growing outside the uterus (endometriosis).  Pregnancy that starts in the fallopian tube (tubal pregnancy).  Pregnancy or breastfeeding your baby. This can cause vaginal dryness.  A tilting or prolapse of the uterus. Prolapse is when weak and stretched muscles around the uterus allow it to fall into the vagina.  Problems with the ovaries, cysts, or scar tissue. This may be worse with certain sexual positions.  Previous surgeries causing adhesions or scar tissue in the vagina or pelvis.  Bladder and intestinal problems.  Psychological problems (such as depression or anxiety). This may make pain worse.  Negative attitudes about sex, experiencing rape, sexual assault, and misinformation about sex. These issues are often related to some types of pain.  Previous pelvic infection, causing scar tissue in the pelvis and on the female organs.  Cyst or tumor on the ovary.  Cancer of the female organs.  Certain medicines.  Medical problems such as diabetes, arthritis, or thyroid disease. Female In men, there are many physical causes of sexual discomfort. Some causes of pain during intercourse are:  Infections of the prostate, bladder, or seminal vesicles. This can cause pain after ejaculation.  An inflamed bladder (interstitial cystitis). This may cause pain from ejaculation.  Gonorrheal infections. This may cause pain during ejaculation.  An inflamed urethra (urethritis) or inflamed prostate (prostatitis). This can make genital stimulation painful or uncomfortable.  Deformities of the penis, such as Peyronie's disease.  A tight foreskin.  Cancer of the female organs.  Psychological problems. This may make pain  worse. DIAGNOSIS   Your caregiver will take a history and have you describe where the pain is located (outside the vagina, in the vagina, in the pelvis). You may be asked when you experience pain, such as with penetration or with thrusting.  Following this, your caregiver will do a physical exam. Let your caregiver know if the exam is too painful.  During the final part of the female exam, your caregiver will feel your uterus and ovaries with one hand on the abdomen and one finger in your vagina. This is a pelvic exam.  Blood tests, a Pap test, cultures for infection, an ultrasound test, and X-rays may be done. You may need to see a specialist for female problems (gynecologist).  Your caregiver may do a CT scan, MRI, or laparoscopy. Laparoscopy is a procedure to look into the pelvis with a lighted tube, through a cut (incision) in the abdomen. TREATMENT  Your caregiver can help you determine the best course of treatment. Sometimes, more testing is done. Continue with the suggested testing until your caregiver feels sure about your diagnosis and how to treat it. Sometimes, it is difficult to find the reason for the pain. The search for the cause and treatment can be frustrating. Treatment often takes several weeks to a few months before you notice any improvement. You may also need to avoid sexual activity until symptoms improve.Continuing to have sex when it hurts can delay healing and actually make the problem worse. The treatment depends on the cause of the pain. Treatment may include:  Medicines such as antibiotics, vaginal or skin creams, hormones, or antidepressants.  Minor or major surgery.  Psychological counseling or group therapy.  Kegel exercises and vaginal dilators to help certain cases of vaginismus (spasms). Do this only if recommended by your caregiver.Kegel exercises can make some problems worse.  Applying lubrication as recommended by your caregiver if you have  dryness.  Sex therapy for you and your sex partner. It is common for the pain to continue after the reason for the pain has been treated. Some reasons for this include a conditioned response. This means the person having the pain becomes so familiar with the pain that the pain continues as a response, even though the cause is removed. Sex therapy can help with this problem. HOME CARE INSTRUCTIONS   Follow your caregiver's instructions about taking medicines, tests, counseling, and follow-up treatment.  Do not use scented tampons, douches, vaginal sprays, or soaps.  Use water-based lubricants for dryness. Oil lubricants can cause irritation.  Do not use spermicides or condoms that irritate you.  Openly discuss with your partner your sexual experience, your desires, foreplay, and different sexual positions for a more comfortable and enjoyable sexual relationship.  Join group sessions for therapy, if needed.  Practice safe sex at all times.  Empty your bladder before having intercourse.  Try different positions during sexual intercourse.  Take over-the-counter pain medicine recommended by your caregiver before having sexual intercourse.  Do not wear pantyhose. Knee-high and thigh-high hose are okay.  Avoid scrubbing your vulva with a washcloth. Wash the area gently and pat dry with a towel. SEEK MEDICAL CARE IF:   You develop vaginal bleeding after sexual intercourse.  You develop a lump at the opening of your vagina, even if it is not painful.  You have abnormal vaginal discharge.  You have vaginal dryness.  You have itching or irritation of  the vulva or vagina.  You develop a rash or reaction to your medicine. SEEK IMMEDIATE MEDICAL CARE IF:   You develop severe abdominal pain during or shortly after sexual intercourse. You could have a ruptured ovarian cyst or ruptured tubal pregnancy.  You have a fever.  You have painful or bloody urination.  You have painful sexual  intercourse, and you never had it before.  You pass out after having sexual intercourse. Document Released: 12/13/2007 Document Revised: 02/15/2012 Document Reviewed: 02/23/2011 [facility] Patient Information 2013 [place], [place].

## 2013-03-26 NOTE — Progress Notes (Signed)
  Assessment:  dyspareunia, uterine retroversion   Plan:  Counselled over anatomy, uterine contact and adjustments to make to minimize uterine contact  Subjective:  [name] is a 22 y.o. female, G1P1001, who presents for painful intercourse, deep thrust dyspareunia. Pt has a new partner x 7 months, and anatomy is different,   The following portions of the patient's history were reviewed and updated as appropriate: allergies, current medications, past medical & surgical history, & past family history.   [place] of Systems Pertinent items are noted in HPI. GU: Negative for dysuria, frequency, urgency or incontinence.   GYN: Patient's last menstrual period was 02/27/2013. currently does NOT want pregnancy  Objective:  BP 122/82  Ht 5\' 6"  (1.676 m)  Wt 172 lb (78.019 kg)  BMI 27.77 kg/m2  LMP 02/27/2013  Breastfeeding? No    BMI: Body mass index is 27.77 kg/(m^2).  General Appearance: Alert, appropriate appearance for age. No acute distress HEENT: Grossly normal Neck / Thyroid: Supple, no masses, nodes or enlargement Cardiovascular: Regular rate and rhythm. S1, S2, no murmur Lungs: Clear to auscultation bilaterally Back: No CVA tenderness Gastrointestinal: Soft, non-tender, no masses or organomegaly Pelvic Exam: External genitalia: normal general appearance Vaginal: normal mucosa without prolapse or lesions Cervix: nontender, well supported,  Uterus: retroverted, tender and easily accessible on digital exam, seemingly reproducing dyspereunia.  Adnexae nontender Rectovaginal: not indicated    Christin Bach MD

## 2013-03-30 LAB — MISCELLANEOUS TEST

## 2013-04-05 ENCOUNTER — Ambulatory Visit: Payer: Self-pay | Admitting: Advanced Practice Midwife

## 2013-04-10 ENCOUNTER — Encounter: Payer: Self-pay | Admitting: Obstetrics and Gynecology

## 2013-04-10 ENCOUNTER — Other Ambulatory Visit (HOSPITAL_COMMUNITY)
Admission: RE | Admit: 2013-04-10 | Discharge: 2013-04-10 | Disposition: A | Discharge status: Home / Self Care | Payer: Medicaid Other | Source: Ambulatory Visit | Attending: Obstetrics and Gynecology | Admitting: Obstetrics and Gynecology

## 2013-04-10 ENCOUNTER — Ambulatory Visit (INDEPENDENT_AMBULATORY_CARE_PROVIDER_SITE_OTHER): Payer: Medicaid Other | Admitting: Obstetrics and Gynecology

## 2013-04-10 VITALS — BP 110/78 | Ht 66.75 in | Wt 170.4 lb

## 2013-04-10 DIAGNOSIS — Z01419 Encounter for gynecological examination (general) (routine) without abnormal findings: Secondary | ICD-10-CM | POA: Insufficient documentation

## 2013-04-10 NOTE — Progress Notes (Signed)
  Assessment:  Normal Gyn Exam   Plan:  1. Mammogram 2. pap smear done, next pap due 2017 3. return annually or prn  Subjective:  [name] is a 22 y.o. female G1P1001 who presents for annual exam.  The patient has complaints today of needs ocp  The following portions of the patient's history were reviewed and updated as appropriate: allergies, current medications, past family history, past medical history, past social history, past surgical history and problem list.  Review of Systems A comprehensive review of systems was negative. Pt is still hoping for becoming a psychiatrist  Objective:  BP 110/78  Ht 5' 6.75" (1.695 m)  Wt 77.293 kg (170 lb 6.4 oz)  BMI 26.9 kg/m2  LMP 03/30/2013  BMI: Body mass index is 26.9 kg/(m^2). General Appearance: Alert, appropriate appearance for age. No acute distress HEENT: Grossly normal Neck / Thyroid:  Cardiovascular: RRR; normal S1, S2, no murmur Lungs: CTA bilaterally Back: No CVAT Breast Exam: No dimpling, nipple retraction or discharge. No masses or nodes., Normal breast tissue bilaterally and No masses or nodes.No dimpling, nipple retraction or discharge. Gastrointestinal: Soft, non-tender, no masses or organomegaly Pelvic Exam: Vulva and vagina appear normal. Bimanual exam reveals normal uterus and adnexa. Uterus: retroverted Rectovaginal: not indicated Lymphatic Exam: Non-palpable nodes in neck, clavicular, axillary, or inguinal regions Skin: no rash or abnormalities Neurologic: Normal gait and speech, no tremor  Psychiatric: Alert and oriented, appropriate affect.  Urinalysis:normal and Not done  Christin Bach. MD Pgr [phone] 2:40 PM

## 2013-04-10 NOTE — Patient Instructions (Addendum)

## 2013-04-13 ENCOUNTER — Encounter: Payer: Self-pay | Admitting: Obstetrics and Gynecology

## 2013-05-15 ENCOUNTER — Telehealth (HOSPITAL_COMMUNITY): Payer: Self-pay

## 2013-05-15 NOTE — Telephone Encounter (Signed)
Message copied by Evelena Leyden on Mon May 15, 2013 10:42 AM ------      Message from: Mariel Sleet, ERIC S      Created: Wed May 10, 2013  5:03 PM       Please call in my absence to see how she is doing      Still trying to get pregnant?      Any leg problems, etc??      Let me know when I return ------

## 2013-05-15 NOTE — Telephone Encounter (Signed)
Call to [facility] and at this time she is not trying to get pregnant and is not having any leg or other problems.

## 2013-05-31 ENCOUNTER — Telehealth (HOSPITAL_COMMUNITY): Payer: Self-pay | Admitting: *Deleted

## 2013-05-31 NOTE — Telephone Encounter (Signed)
Reason for Call    None           Call Documentation    Evelena Leyden, RN at 05/15/2013 10:43 AM    Status: Signed             Call to [facility] and at this time she is not trying to get pregnant and is not having any leg or other problems.        Evelena Leyden, RN at 05/15/2013 10:42 AM    Status: Signed             Message copied by Evelena Leyden on Mon May 15, 2013 10:42 AM  ------  Message from: Mariel Sleet, ERIC S  Created: Wed May 10, 2013 5:03 PM  Please call in my absence to see how she is doing  Still trying to get pregnant?  Any leg problems, etc??  Let me know when I return  ------

## 2013-06-28 ENCOUNTER — Telehealth: Payer: Self-pay | Admitting: Adult Health

## 2013-06-28 NOTE — Telephone Encounter (Signed)
Number not in service,

## 2013-07-21 ENCOUNTER — Telehealth: Payer: Self-pay | Admitting: Adult Health

## 2013-07-21 NOTE — Telephone Encounter (Signed)
Invalid number will send letter

## 2013-08-09 ENCOUNTER — Ambulatory Visit (HOSPITAL_COMMUNITY): Payer: Medicaid Other

## 2013-08-15 ENCOUNTER — Encounter (HOSPITAL_COMMUNITY): Payer: Self-pay

## 2013-10-02 ENCOUNTER — Encounter (INDEPENDENT_AMBULATORY_CARE_PROVIDER_SITE_OTHER): Payer: Self-pay

## 2013-10-02 ENCOUNTER — Telehealth: Payer: Self-pay | Admitting: Adult Health

## 2013-10-02 ENCOUNTER — Telehealth (HOSPITAL_COMMUNITY): Payer: Self-pay | Admitting: *Deleted

## 2013-10-02 ENCOUNTER — Other Ambulatory Visit (HOSPITAL_COMMUNITY): Payer: Self-pay | Admitting: Oncology

## 2013-10-02 ENCOUNTER — Ambulatory Visit (INDEPENDENT_AMBULATORY_CARE_PROVIDER_SITE_OTHER): Payer: Medicaid Other | Admitting: Adult Health

## 2013-10-02 ENCOUNTER — Other Ambulatory Visit (HOSPITAL_COMMUNITY): Payer: Self-pay | Admitting: *Deleted

## 2013-10-02 ENCOUNTER — Encounter: Payer: Self-pay | Admitting: Adult Health

## 2013-10-02 VITALS — BP 116/70 | Ht 66.0 in | Wt 168.5 lb

## 2013-10-02 DIAGNOSIS — Z3201 Encounter for pregnancy test, result positive: Secondary | ICD-10-CM

## 2013-10-02 DIAGNOSIS — I2699 Other pulmonary embolism without acute cor pulmonale: Secondary | ICD-10-CM

## 2013-10-02 LAB — POCT URINE PREGNANCY: Preg Test, Ur: POSITIVE

## 2013-10-02 MED ORDER — ENOXAPARIN SODIUM 150 MG/ML ~~LOC~~ SOLN: 120.0000 mg | Freq: Every day | SUBCUTANEOUS | Status: DC

## 2013-10-02 NOTE — Telephone Encounter (Signed)
Message copied by Adelene Amas on Mon Oct 02, 2013  3:17 PM ------      Message from: Ellouise Newer      Created: Mon Oct 02, 2013  3:11 PM       Lovenox 120 mg daily #30 with 3 refills escribed to [facility].  She will take Lovenox throughout pregnancy and then 6 weeks post-partum            Is she on Xarelto?  If so, stop and take Lovenox.            She should also be on ASA 81 mg daily.             She needs to be involved with a high-risk OB MD.            She needs an appointment with [place] in 3 months to see MD.            Please document telephone encounter with patient. ------

## 2013-10-02 NOTE — Telephone Encounter (Signed)
Call to patient went straight to her voicemail. I left all info below on voicemail and instructed her to call me to go over this with her. Information was also forwarded to Cyril Mourning at [facility].

## 2013-10-02 NOTE — Progress Notes (Signed)
Pt here for pregnancy test. Positive result. Pt has a history of blood clots and was advised during previous pregnancy to call the cancer center regarding this. Advised pt to call them today and let them know she has had a positive pregnancy test. Advised cramping and spotting can be normal in early pregnancy. Pt reports none at this time. Advised if she did have any cramping or spotting, call office. Pt voiced understanding. JSY

## 2013-10-02 NOTE — Telephone Encounter (Signed)
Received call from Tom the PA in oncology that [facility] called and told him she was pregnant.She has a history of bilateral PE and he says she needs to be on full dose Lovenox the entire pregnancy til 6 weeks postpartum and he would start this.Since she is high risk he just wanted to make [place] aware.

## 2013-10-04 ENCOUNTER — Encounter (HOSPITAL_COMMUNITY): Payer: Self-pay

## 2013-10-04 ENCOUNTER — Telehealth (HOSPITAL_COMMUNITY): Payer: Self-pay

## 2013-10-04 NOTE — Telephone Encounter (Signed)
Message left for patient to contact office to discuss instructions below.

## 2013-10-04 NOTE — Telephone Encounter (Signed)
Message copied by Evelena Leyden on Wed Oct 04, 2013  5:51 PM ------      Message from: Ellouise Newer      Created: Mon Oct 02, 2013  3:11 PM       Lovenox 120 mg daily #30 with 3 refills escribed to [facility] [place].  She will take Lovenox throughout pregnancy and then 6 weeks post-partum            Is she on Xarelto?  If so, stop and take Lovenox.            She should also be on ASA 81 mg daily.             She needs to be involved with a high-risk OB MD.            She needs an appointment with [place] in 3 months to see MD.            Please document telephone encounter with patient. ------

## 2013-10-04 NOTE — Telephone Encounter (Signed)
Call back received from [facility] and verbalized understanding of instructions.  Stated that prescription that she picked up was for 150 mg of Lovenox and wanted to make sure she should only be taking 120mg  daily.  Also wants to know what she does with the Lovenox when she gets closer to her delivery date?

## 2013-10-04 NOTE — Telephone Encounter (Signed)
Message copied by Evelena Leyden on Wed Oct 04, 2013  8:45 AM ------      Message from: Ellouise Newer      Created: Mon Oct 02, 2013  3:11 PM       Lovenox 120 mg daily #30 with 3 refills escribed to [facility] [place].  She will take Lovenox throughout pregnancy and then 6 weeks post-partum            Is she on Xarelto?  If so, stop and take Lovenox.            She should also be on ASA 81 mg daily.             She needs to be involved with a high-risk OB MD.            She needs an appointment with [place] in 3 months to see MD.            Please document telephone encounter with patient. ------

## 2013-10-05 ENCOUNTER — Telehealth (HOSPITAL_COMMUNITY): Payer: Self-pay

## 2013-10-05 NOTE — Telephone Encounter (Signed)
Patient notified with information below.    Ellouise Newer, PA-C at 10/05/2013 1:38 PM   Status: Signed            She should be taking 120 mg daily. OB should guide lovenox therapy nearer to delivery. We would be glad to help coordinate with OB.        Evelena Leyden, RN at 10/04/2013 5:53 PM    Status: Signed             Call back received from [facility] and verbalized understanding of instructions. Stated that prescription that she picked up was for 150 mg of Lovenox and wanted to make sure she should only be taking 120mg  daily. Also wants to know what she does with the Lovenox when she gets closer to her delivery date?

## 2013-10-05 NOTE — Telephone Encounter (Signed)
She should be taking 120 mg daily.  OB should guide lovenox therapy nearer to delivery.  We would be glad to help coordinate with OB.

## 2013-10-13 ENCOUNTER — Other Ambulatory Visit: Payer: Self-pay | Admitting: Obstetrics & Gynecology

## 2013-10-13 DIAGNOSIS — O3680X0 Pregnancy with inconclusive fetal viability, not applicable or unspecified: Secondary | ICD-10-CM

## 2013-10-16 ENCOUNTER — Other Ambulatory Visit: Payer: Self-pay | Admitting: Obstetrics & Gynecology

## 2013-10-16 ENCOUNTER — Encounter: Payer: Self-pay | Admitting: Adult Health

## 2013-10-16 ENCOUNTER — Ambulatory Visit (INDEPENDENT_AMBULATORY_CARE_PROVIDER_SITE_OTHER): Payer: Medicaid Other | Admitting: Adult Health

## 2013-10-16 ENCOUNTER — Ambulatory Visit (INDEPENDENT_AMBULATORY_CARE_PROVIDER_SITE_OTHER): Payer: Medicaid Other

## 2013-10-16 VITALS — BP 112/66 | Wt 170.0 lb

## 2013-10-16 DIAGNOSIS — Z1389 Encounter for screening for other disorder: Secondary | ICD-10-CM

## 2013-10-16 DIAGNOSIS — O3680X0 Pregnancy with inconclusive fetal viability, not applicable or unspecified: Secondary | ICD-10-CM

## 2013-10-16 DIAGNOSIS — Z86711 Personal history of pulmonary embolism: Secondary | ICD-10-CM

## 2013-10-16 DIAGNOSIS — O09299 Supervision of pregnancy with other poor reproductive or obstetric history, unspecified trimester: Secondary | ICD-10-CM

## 2013-10-16 DIAGNOSIS — Z87898 Personal history of other specified conditions: Secondary | ICD-10-CM

## 2013-10-16 DIAGNOSIS — O98519 Other viral diseases complicating pregnancy, unspecified trimester: Secondary | ICD-10-CM

## 2013-10-16 DIAGNOSIS — Z331 Pregnant state, incidental: Secondary | ICD-10-CM

## 2013-10-16 DIAGNOSIS — O9989 Other specified diseases and conditions complicating pregnancy, childbirth and the puerperium: Secondary | ICD-10-CM

## 2013-10-16 DIAGNOSIS — O0991 Supervision of high risk pregnancy, unspecified, first trimester: Secondary | ICD-10-CM

## 2013-10-16 DIAGNOSIS — O0990 Supervision of high risk pregnancy, unspecified, unspecified trimester: Secondary | ICD-10-CM | POA: Insufficient documentation

## 2013-10-16 DIAGNOSIS — Z3481 Encounter for supervision of other normal pregnancy, first trimester: Secondary | ICD-10-CM

## 2013-10-16 HISTORY — DX: Personal history of other specified conditions: Z87.898

## 2013-10-16 HISTORY — DX: Supervision of high risk pregnancy, unspecified, first trimester: O09.91

## 2013-10-16 LAB — CBC
HCT: 36.9 % (ref 36.0–46.0)
Hemoglobin: 12.9 g/dL (ref 12.0–15.0)
MCH: 29 pg (ref 26.0–34.0)
MCHC: 35 g/dL (ref 30.0–36.0)
MCV: 82.9 fL (ref 78.0–100.0)
RDW: 13.8 % (ref 11.5–15.5)
WBC: 6.4 10*3/uL (ref 4.0–10.5)

## 2013-10-16 LAB — POCT URINALYSIS DIPSTICK
Blood, UA: NEGATIVE
Ketones, UA: NEGATIVE
Nitrite, UA: NEGATIVE
Protein, UA: NEGATIVE

## 2013-10-16 NOTE — Progress Notes (Signed)
Pt wants to discuss blood thinners, has some concerns about going into labor early. Pt given CCNC form and lab consents to read over and sign.

## 2013-10-16 NOTE — Patient Instructions (Signed)

## 2013-10-16 NOTE — Assessment & Plan Note (Signed)
Had PE 2012 with negative w/u but had ? Low protein Dad had clot right before she did

## 2013-10-16 NOTE — Progress Notes (Signed)
  Subjective:    [name] is a 22 y.o. G38P1021 African American female at [redacted]w[redacted]d by LMP being seen today for her first obstetrical visit.  Her obstetrical history is significant for pulmonatry embolus in 2012, on lovenox 120 mg daily in one dose.  Pregnancy history fully reviewed.Had LSIL in May 2014 on pap.   Patient reports nausea.  Filed Vitals:   10/16/13 0945  BP: 112/66  Weight: 170 lb (77.111 kg)    HISTORY: OB History  Gravida Para Term Preterm AB SAB TAB Ectopic Multiple Living  4 1 1  2  2   1     # Outcome Date GA Lbr Len/2nd Weight Sex Delivery Anes PTL Lv  4 CUR           3 TAB 2013          2 TAB 2012          1 TRM 2010 [redacted]w[redacted]d  6 lb 5 oz (2.863 kg) M SVD EPI  Y     Past Medical History  Diagnosis Date  . Hx of blood clots   . 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   . Family history of pulmonary embolism 02/08/2013    Father  . Chlamydia   . HSV-2 (herpes simplex virus 2) infection    Past Surgical History  Procedure Laterality Date  . Induced abortion  2012,2013   Family History  Problem Relation Age of Onset  . Clotting disorder Father   . Diabetes Maternal Grandmother   . Hypertension Maternal Grandmother   . CAD Maternal Grandmother      Exam    Pelvic Exam:    Perineum: deferred   Vulva: deferred   Vagina:  deferred   Uterus   7 weeks     Cervix: deferred   Adnexa: Not palpable   Urinary: deferred    System:     Skin: normal coloration and turgor, no rashes Has bruising on abdomen from lovenox   Neurologic: oriented, normal mood   Extremities: normal strength, tone, and muscle mass   HEENT PERRLA, thyroid normal   Mouth/Teeth mucous membranes moist   Cardiovascular: regular rate and rhythm   Respiratory:  appears well, vitals normal, no respiratory distress, acyanotic, normal RR   Abdomen: soft,  non-tender      Assessment:    Pregnancy: [id number] Patient Active Problem List   Diagnosis Date Noted  . Dyspareunia 03/23/2013  . Chlamydia 03/22/2013  . 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 PE (pulmonary embolism) 02/08/2013  . Family history of pulmonary embolism 02/08/2013      [redacted]w[redacted]d [id number] New OB visit    Plan:     Initial labs drawn Continue prenatal vitamins Problem list reviewed and updated Reviewed n/v relief measures and warning s/s to report Reviewed recommended weight gain based on pre-gravid BMI Encouraged well-balanced diet Genetic Screening discussed Integrated Screen: requested Cystic fibrosis screening discussed requested Ultrasound discussed; fetal survey: requested Follow up in 1 weeks to see Dr Despina Hidden for high risk OB visit and pap. No sex, has clot in sac per [place], discussed pt with Dr Despina Hidden  GRIFFIN,JENNIFER 10/16/2013 10:20 AM

## 2013-10-16 NOTE — Progress Notes (Signed)
U/S(6+5wks)-transabdominal u/s performed, single IUP with +FCA noted, FHR- 130bpm, +YS noted, 18 x 13 mm ??clot noted within gestational sac separate from embryo and YS, no Doppler flow noted within, cx appears long and closed, bilateral adnexa wnl, no free fluid noted within pelvis

## 2013-10-17 LAB — RPR

## 2013-10-17 LAB — DRUG SCREEN, URINE, NO CONFIRMATION
Amphetamine Screen, Ur: NEGATIVE
Barbiturate Quant, Ur: NEGATIVE
Marijuana Metabolite: NEGATIVE
Methadone: NEGATIVE
Opiate Screen, Urine: NEGATIVE
Propoxyphene: NEGATIVE

## 2013-10-17 LAB — ANTIBODY SCREEN: Antibody Screen: NEGATIVE

## 2013-10-17 LAB — URINALYSIS
Bilirubin Urine: NEGATIVE
Glucose, UA: NEGATIVE mg/dL
Hgb urine dipstick: NEGATIVE
Leukocytes, UA: NEGATIVE
Protein, ur: NEGATIVE mg/dL
pH: 6.5 (ref 5.0–8.0)

## 2013-10-17 LAB — RUBELLA SCREEN: Rubella: 3.14 Index — ABNORMAL HIGH (ref <0.90)

## 2013-10-17 LAB — HIV ANTIBODY (ROUTINE TESTING W REFLEX): HIV: NONREACTIVE

## 2013-10-17 LAB — OXYCODONE SCREEN, UA, RFLX CONFIRM: Oxycodone Screen, Ur: NEGATIVE ng/mL

## 2013-10-17 LAB — CYSTIC FIBROSIS DIAGNOSTIC STUDY

## 2013-10-17 LAB — ABO AND RH

## 2013-10-17 LAB — HEPATITIS B SURFACE ANTIGEN: Hepatitis B Surface Ag: NEGATIVE

## 2013-10-18 LAB — URINE CULTURE: Organism ID, Bacteria: NO GROWTH

## 2013-10-23 ENCOUNTER — Ambulatory Visit (INDEPENDENT_AMBULATORY_CARE_PROVIDER_SITE_OTHER): Payer: Medicaid Other | Admitting: Obstetrics and Gynecology

## 2013-10-23 VITALS — BP 112/78 | Wt 168.0 lb

## 2013-10-23 DIAGNOSIS — Z1389 Encounter for screening for other disorder: Secondary | ICD-10-CM

## 2013-10-23 DIAGNOSIS — Z86711 Personal history of pulmonary embolism: Secondary | ICD-10-CM

## 2013-10-23 DIAGNOSIS — Z331 Pregnant state, incidental: Secondary | ICD-10-CM

## 2013-10-23 DIAGNOSIS — O98519 Other viral diseases complicating pregnancy, unspecified trimester: Secondary | ICD-10-CM

## 2013-10-23 DIAGNOSIS — I2699 Other pulmonary embolism without acute cor pulmonale: Secondary | ICD-10-CM

## 2013-10-23 DIAGNOSIS — O9989 Other specified diseases and conditions complicating pregnancy, childbirth and the puerperium: Secondary | ICD-10-CM

## 2013-10-23 DIAGNOSIS — O09299 Supervision of pregnancy with other poor reproductive or obstetric history, unspecified trimester: Secondary | ICD-10-CM

## 2013-10-23 LAB — POCT URINALYSIS DIPSTICK
Blood, UA: NEGATIVE
Glucose, UA: NEGATIVE
Ketones, UA: NEGATIVE
Leukocytes, UA: NEGATIVE
Nitrite, UA: NEGATIVE
Protein, UA: NEGATIVE

## 2013-10-23 MED ORDER — PRENATAL PLUS 27-1 MG PO TABS: 1.0000 | ORAL_TABLET | Freq: Every day | ORAL | Status: DC

## 2013-10-23 NOTE — Patient Instructions (Signed)
Continue lovenox at 120 mg/d

## 2013-10-23 NOTE — Progress Notes (Signed)
Discussed lovenox, currently advised to [place] 120 mg/day, technique of injection reveiwed.

## 2013-11-08 ENCOUNTER — Other Ambulatory Visit: Payer: Self-pay | Admitting: Obstetrics and Gynecology

## 2013-11-08 ENCOUNTER — Other Ambulatory Visit: Payer: Self-pay | Admitting: Obstetrics & Gynecology

## 2013-11-08 DIAGNOSIS — O2690 Pregnancy related conditions, unspecified, unspecified trimester: Secondary | ICD-10-CM

## 2013-11-13 ENCOUNTER — Other Ambulatory Visit: Payer: Medicaid Other

## 2013-11-13 ENCOUNTER — Encounter: Payer: Medicaid Other | Admitting: Women's Health

## 2013-11-21 ENCOUNTER — Encounter: Payer: Self-pay | Admitting: Obstetrics & Gynecology

## 2013-11-21 ENCOUNTER — Ambulatory Visit (INDEPENDENT_AMBULATORY_CARE_PROVIDER_SITE_OTHER): Payer: Medicaid Other | Admitting: Obstetrics & Gynecology

## 2013-11-21 VITALS — BP 106/60 | Wt 166.0 lb

## 2013-11-21 DIAGNOSIS — O09299 Supervision of pregnancy with other poor reproductive or obstetric history, unspecified trimester: Secondary | ICD-10-CM

## 2013-11-21 DIAGNOSIS — O98519 Other viral diseases complicating pregnancy, unspecified trimester: Secondary | ICD-10-CM

## 2013-11-21 DIAGNOSIS — Z3483 Encounter for supervision of other normal pregnancy, third trimester: Secondary | ICD-10-CM

## 2013-11-21 DIAGNOSIS — Z86711 Personal history of pulmonary embolism: Secondary | ICD-10-CM

## 2013-11-21 DIAGNOSIS — Z1389 Encounter for screening for other disorder: Secondary | ICD-10-CM

## 2013-11-21 DIAGNOSIS — Z331 Pregnant state, incidental: Secondary | ICD-10-CM

## 2013-11-21 LAB — POCT URINALYSIS DIPSTICK
Blood, UA: NEGATIVE
Glucose, UA: NEGATIVE
Ketones, UA: NEGATIVE

## 2013-11-21 NOTE — Addendum Note (Signed)
Addended by: Criss Alvine on: 11/21/2013 04:49 PM   Modules accepted: Orders

## 2013-11-21 NOTE — Progress Notes (Signed)
Wants IT, will schedule NT and 1st IT No bleeding no complaints

## 2013-11-22 ENCOUNTER — Other Ambulatory Visit: Payer: Medicaid Other

## 2013-11-22 IMAGING — CT CT ANGIO CHEST
2 of 6 series · 19 of 36 positions shown · IV contrast (omnipaque)
Comparison: 05/02/2011 CT

CLINICAL DATA: 20-year-old female with chest pain.  History of
pulmonary emboli.

CT ANGIOGRAPHY CHEST
TECHNIQUE: Multidetector CT imaging of the chest using the
standard protocol during bolus administration of intravenous
contrast. Multiplanar reconstructed images including MIPs were
obtained and reviewed to evaluate the vascular anatomy.
The patient's abdomen was shielded given the patient's positive
pregnancy test.
Contrast: 80mL OMNIPAQUE IOHEXOL 350 MG/ML IV SOLN

[Series 5: pe 1.0 b25f · axial · 0.64mm/px · z∈[-152,+49]mm · 18 of 225 slices shown]
[im 12/225  lung]
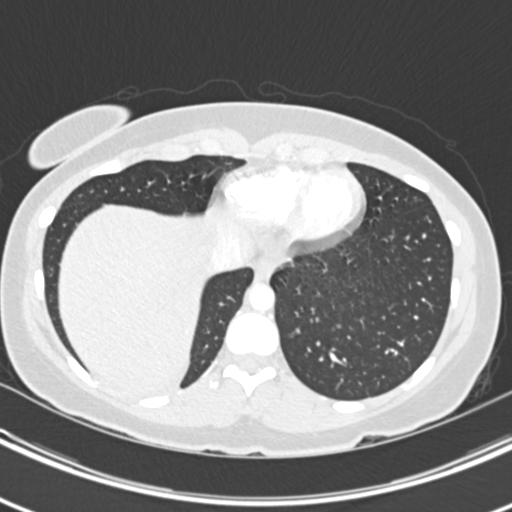
[im 23/225  mediastinal]
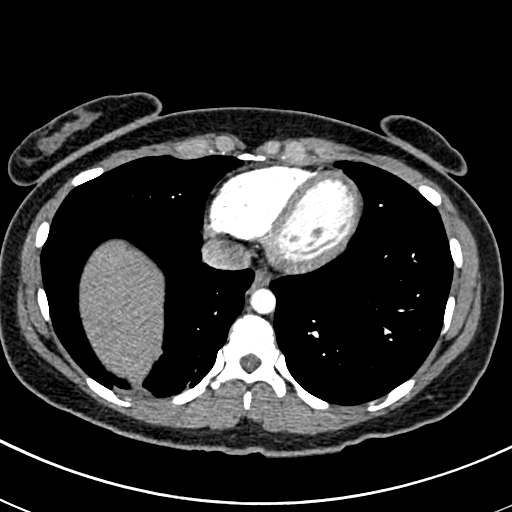
[im 34/225  lung]
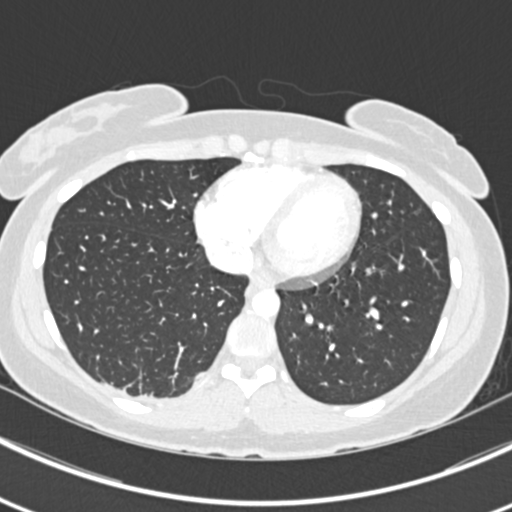
[im 45/225  mediastinal]
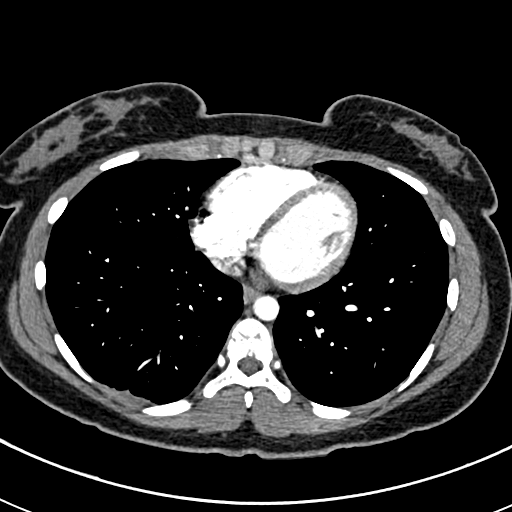
[im 57/225  lung]
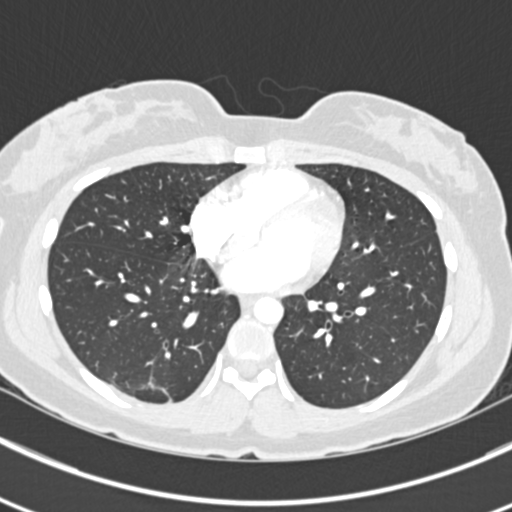
[im 68/225  mediastinal]
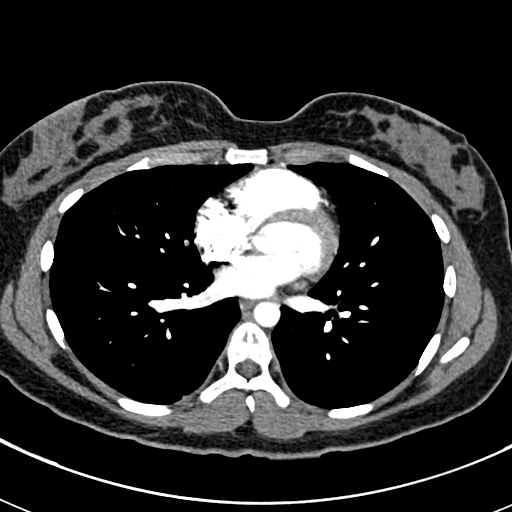
[im 79/225  lung]
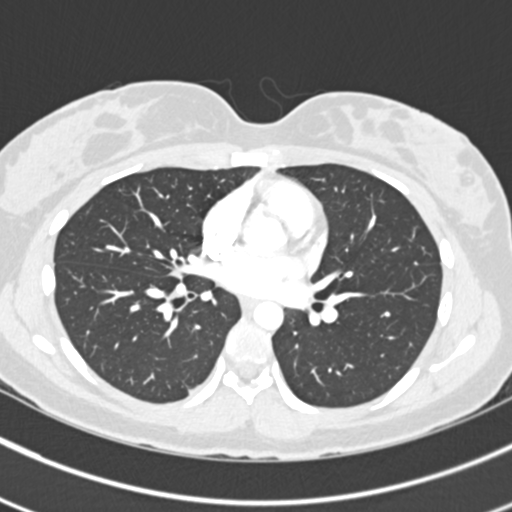
[im 90/225  mediastinal]
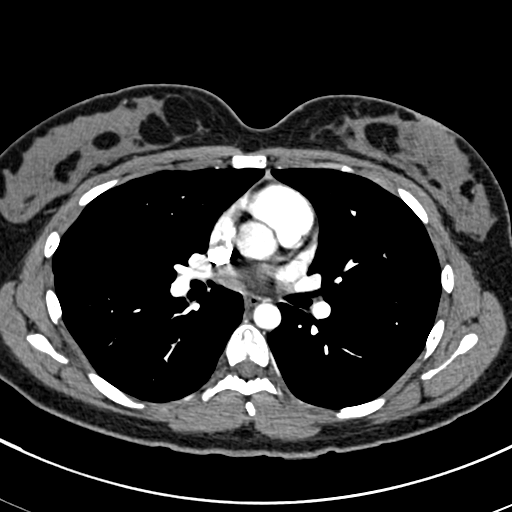
[im 101/225  lung]
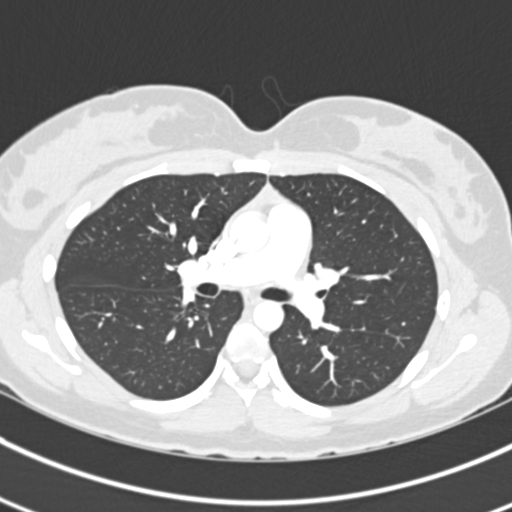
[im 124/225  mediastinal]
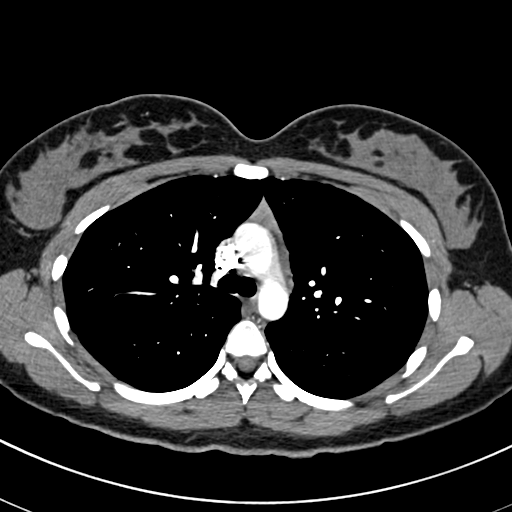
[im 135/225  lung]
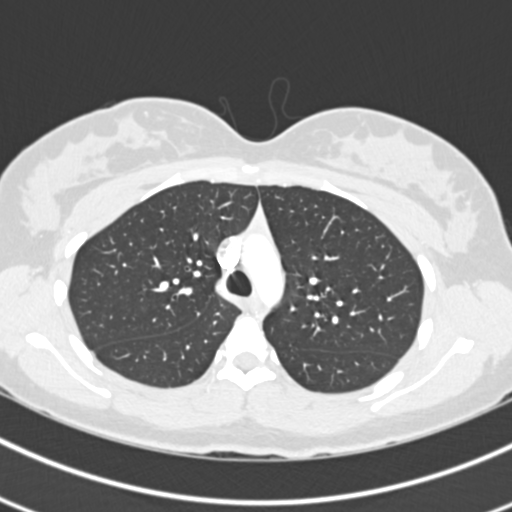
[im 146/225  mediastinal]
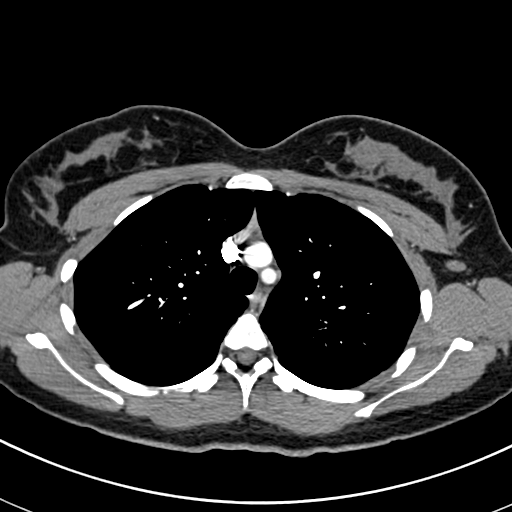
[im 157/225  lung]
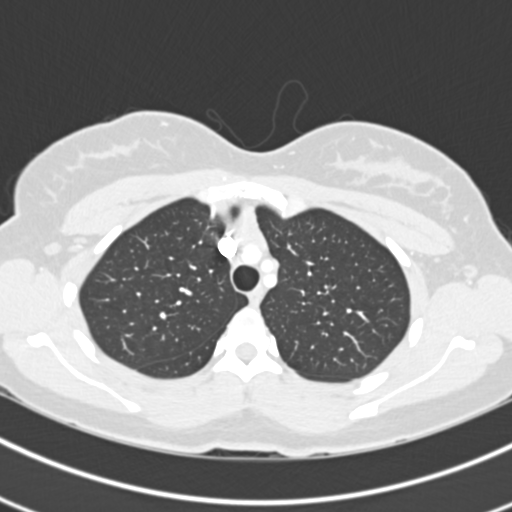
[im 169/225  mediastinal]
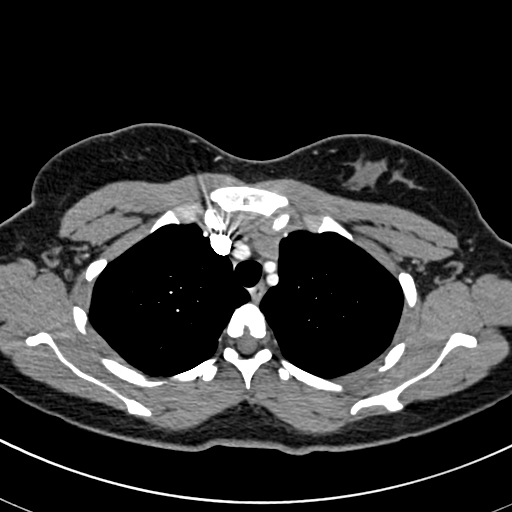
[im 180/225  lung]
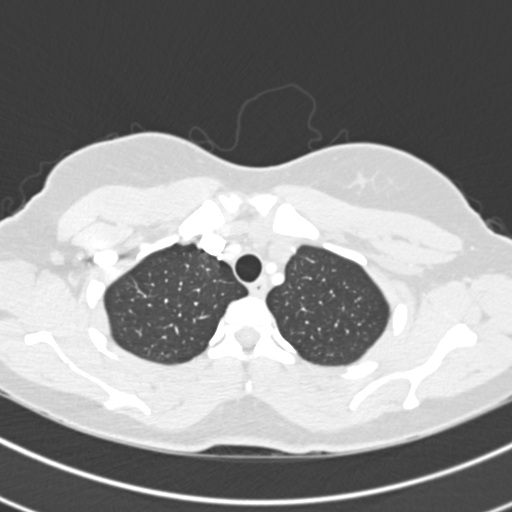
[im 191/225  mediastinal]
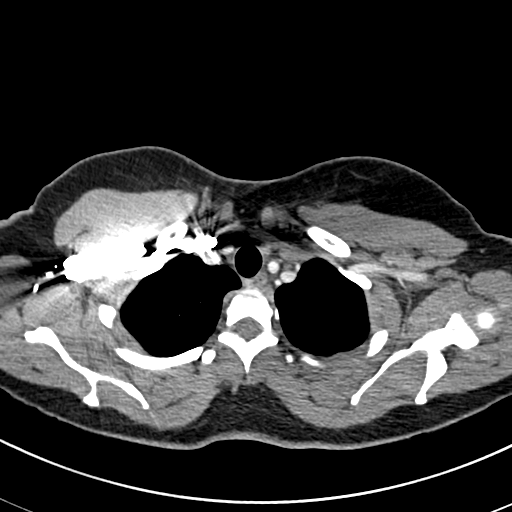
[im 202/225  lung]
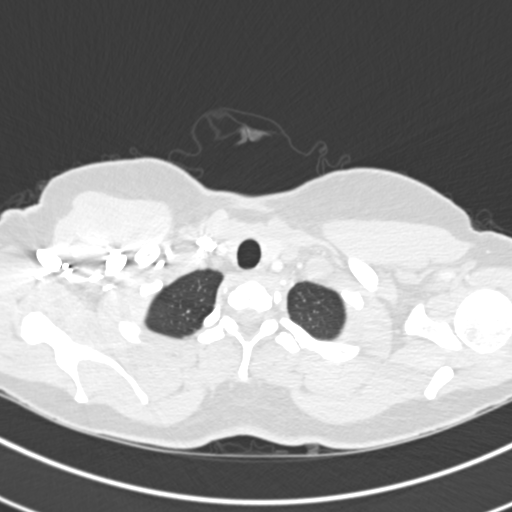
[im 213/225  mediastinal]
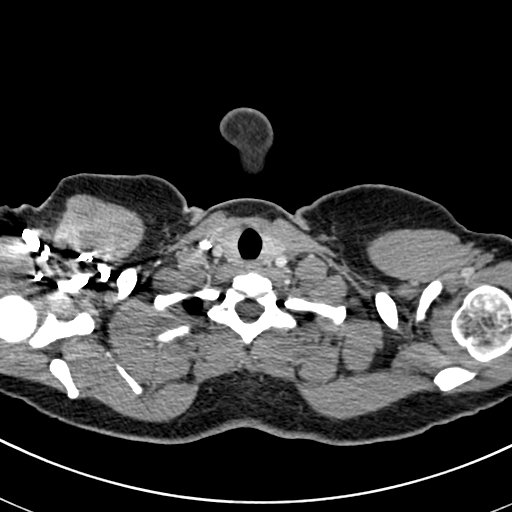

[Series 8: pe 2.0 coronal · coronal · 0.47mm/px · 1 of 117 slices shown]
[im 59/117  mediastinal]
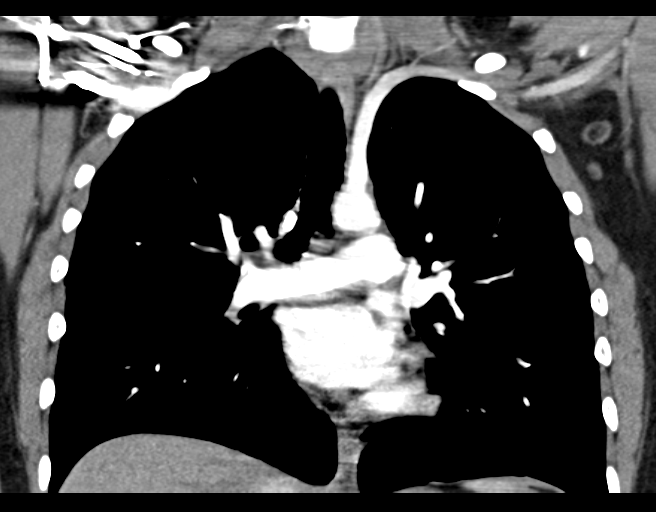

[19 of 36 positions shown; findings below may reference images not displayed]

FINDINGS: This is a technically satisfactory study.

No pulmonary emboli are identified.
There is no evidence of thoracic aortic aneurysm or definite
dissection.

The heart and great vessels are within normal limits.

Minimal right basilar scarring is noted.
The lungs are otherwise clear.
There is no evidence of airspace disease, consolidation,
nodules/mass or endobronchial/endotracheal lesion.

No acute or suspicious bony abnormalities are identified.
Portions of the visualized upper abdomen are unremarkable.
IMPRESSION: No evidence of acute abnormality - no evidence of pulmonary emboli
or aortic aneurysm/definite dissection.

Mild right basilar scarring.

## 2013-11-24 ENCOUNTER — Other Ambulatory Visit: Payer: Self-pay | Admitting: Obstetrics & Gynecology

## 2013-11-24 ENCOUNTER — Encounter (INDEPENDENT_AMBULATORY_CARE_PROVIDER_SITE_OTHER): Payer: Self-pay

## 2013-11-24 ENCOUNTER — Ambulatory Visit (INDEPENDENT_AMBULATORY_CARE_PROVIDER_SITE_OTHER): Payer: Medicaid Other

## 2013-11-24 DIAGNOSIS — Z3483 Encounter for supervision of other normal pregnancy, third trimester: Secondary | ICD-10-CM

## 2013-11-24 DIAGNOSIS — Z36 Encounter for antenatal screening of mother: Secondary | ICD-10-CM

## 2013-11-24 NOTE — Progress Notes (Signed)
U/S(12+2wks)-single IUP with +FCA noted, FHR-161bpm, cx long and closed (3.4cm), bilateral adnexa WNL, CRL  C/W dates, NB present, NT-1.61mm

## 2013-12-07 NOTE — L&D Delivery Note (Signed)
Delivery Note At 11:12 AM a viable female was delivered via Vaginal, Spontaneous Delivery (Presentation: Right Occiput Anterior).  APGAR:8 ,9 ; weight pending .   Placenta status: intact , .  Cord: 3 vessels with the following complications: None.    Anesthesia: Epidural  Episiotomy: None Lacerations: 1st degree;Periurethral Suture Repair: 2.0 vicryl Est. Blood Loss (mL): 400  Mom to postpartum.  Baby to Couplet care / Skin to Skin.  Called to delivery. Mother pushed over 5 min. Infant delivered to maternal abdomen. Delayed cord clamping performed.  Active management of 3rd stage with traction and Pitocin and cytotec. Placenta delivered intact with 3v cord. Tear repaired with 3.0 vicryl on CT in usual manner. EBL400. Counts correct. Hemostatic.   Yolande Jollyaleb G Melancon, MD Resident  Melancon, Hillery Hunteraleb G 06/12/2014, 11:46 AM   I was present for and supervised the delivery of this newborn by the resident. I agree with above documentation. Uterus remained boggy after delivery and required bimanual massage and cytotec 1000mcg with good results.  1st degree repaired as above. Periurethral tears were hemostatic.  Mom and baby to postpartum.   Alexandros Ewan, Redmond BasemanKELI L, MD

## 2013-12-20 ENCOUNTER — Ambulatory Visit (INDEPENDENT_AMBULATORY_CARE_PROVIDER_SITE_OTHER): Payer: Medicaid Other | Admitting: Obstetrics and Gynecology

## 2013-12-20 ENCOUNTER — Encounter: Payer: Self-pay | Admitting: Obstetrics and Gynecology

## 2013-12-20 ENCOUNTER — Encounter (INDEPENDENT_AMBULATORY_CARE_PROVIDER_SITE_OTHER): Payer: Self-pay

## 2013-12-20 ENCOUNTER — Other Ambulatory Visit: Payer: Self-pay | Admitting: Obstetrics & Gynecology

## 2013-12-20 VITALS — BP 118/76 | Wt 167.0 lb

## 2013-12-20 DIAGNOSIS — O98519 Other viral diseases complicating pregnancy, unspecified trimester: Secondary | ICD-10-CM

## 2013-12-20 DIAGNOSIS — Z1389 Encounter for screening for other disorder: Secondary | ICD-10-CM

## 2013-12-20 DIAGNOSIS — O09299 Supervision of pregnancy with other poor reproductive or obstetric history, unspecified trimester: Secondary | ICD-10-CM

## 2013-12-20 DIAGNOSIS — Z86711 Personal history of pulmonary embolism: Secondary | ICD-10-CM

## 2013-12-20 DIAGNOSIS — Z331 Pregnant state, incidental: Secondary | ICD-10-CM

## 2013-12-20 DIAGNOSIS — O0992 Supervision of high risk pregnancy, unspecified, second trimester: Secondary | ICD-10-CM

## 2013-12-20 LAB — POCT URINALYSIS DIPSTICK
Blood, UA: NEGATIVE
Glucose, UA: NEGATIVE
KETONES UA: NEGATIVE
LEUKOCYTES UA: NEGATIVE
NITRITE UA: NEGATIVE
PROTEIN UA: NEGATIVE

## 2013-12-20 NOTE — Progress Notes (Signed)
Pt denies any problems or concerns at this time.  

## 2013-12-20 NOTE — Progress Notes (Signed)
This chart was scribed for Tracy BachJohn Enya Bureau, MD, by [name], Medical Scribe, on 12/20/13 at 3:53 PM. This chart was reviewed and is accurate per Tracy BachJohn Symir Mah, MD.  Pt is 16w, G4P1A2. Pt denies any complaints. Reports a recent ED visit after son jumped on her stomach 2 days ago. No abnormal findings then. [facility].

## 2013-12-20 NOTE — Patient Instructions (Signed)
Continue lovenox as prescribed For problems , [facility]

## 2013-12-23 LAB — MATERNAL SCREEN, INTEGRATED #2
AFP MoM: 0.94
AFP, Serum: 37.5 ng/mL
Age risk Down Syndrome: 1:1100 {titer}
CROWN RUMP LENGTH MAT SCREEN 2: 63.5 mm
Calculated Gestational Age: 16.3
ESTRIOL FREE MAT SCREEN: 0.94 ng/mL
ESTRIOL MOM MAT SCREEN: 1.04
HCG, MOM MAT SCREEN: 0.95
HCG, SERUM MAT SCREEN: 35.8 [IU]/mL
Inhibin A Dimeric: 149 pg/mL
Inhibin A MoM: 0.89
MSS Down Syndrome: <1:5000 {titer}
MSS Trisomy 18 Risk: <1:5000 {titer}
NT MoM: 0.92
Nuchal Translucency: 1.33 mm
Number of fetuses: 1
PAPP-A MoM: 1.02
PAPP-A: 1241 ng/mL
Rish for ONTD: <1:5000 {titer}

## 2014-01-01 ENCOUNTER — Encounter (HOSPITAL_COMMUNITY): Payer: Medicaid Other

## 2014-01-01 ENCOUNTER — Encounter (HOSPITAL_COMMUNITY): Payer: Self-pay

## 2014-01-01 ENCOUNTER — Encounter (HOSPITAL_COMMUNITY): Payer: Medicaid Other | Attending: Hematology and Oncology

## 2014-01-01 VITALS — BP 106/59 | HR 95 | Temp 98.1°F | Resp 16 | Wt 170.0 lb

## 2014-01-01 DIAGNOSIS — Z86711 Personal history of pulmonary embolism: Secondary | ICD-10-CM | POA: Insufficient documentation

## 2014-01-01 DIAGNOSIS — D6859 Other primary thrombophilia: Secondary | ICD-10-CM

## 2014-01-01 DIAGNOSIS — Z09 Encounter for follow-up examination after completed treatment for conditions other than malignant neoplasm: Secondary | ICD-10-CM | POA: Insufficient documentation

## 2014-01-01 DIAGNOSIS — O0990 Supervision of high risk pregnancy, unspecified, unspecified trimester: Secondary | ICD-10-CM | POA: Insufficient documentation

## 2014-01-01 LAB — CBC WITH DIFFERENTIAL/PLATELET
Basophils Absolute: 0 10*3/uL (ref 0.0–0.1)
Basophils Relative: 0 % (ref 0–1)
EOS ABS: 0.1 10*3/uL (ref 0.0–0.7)
Eosinophils Relative: 1 % (ref 0–5)
HCT: 31.5 % — ABNORMAL LOW (ref 36.0–46.0)
HEMOGLOBIN: 11.1 g/dL — AB (ref 12.0–15.0)
LYMPHS ABS: 1.5 10*3/uL (ref 0.7–4.0)
Lymphocytes Relative: 20 % (ref 12–46)
MCH: 28.6 pg (ref 26.0–34.0)
MCHC: 35.2 g/dL (ref 30.0–36.0)
MCV: 81.2 fL (ref 78.0–100.0)
MONOS PCT: 7 % (ref 3–12)
Monocytes Absolute: 0.5 10*3/uL (ref 0.1–1.0)
NEUTROS PCT: 72 % (ref 43–77)
Neutro Abs: 5.4 10*3/uL (ref 1.7–7.7)
PLATELETS: 263 10*3/uL (ref 150–400)
RBC: 3.88 MIL/uL (ref 3.87–5.11)
RDW: 14 % (ref 11.5–15.5)
WBC: 7.4 10*3/uL (ref 4.0–10.5)

## 2014-01-01 LAB — COMPREHENSIVE METABOLIC PANEL
ALK PHOS: 40 U/L (ref 39–117)
ALT: 9 U/L (ref 0–35)
AST: 14 U/L (ref 0–37)
Albumin: 3.1 g/dL — ABNORMAL LOW (ref 3.5–5.2)
BUN: 4 mg/dL — AB (ref 6–23)
CO2: 24 meq/L (ref 19–32)
Calcium: 9.1 mg/dL (ref 8.4–10.5)
Chloride: 103 mEq/L (ref 96–112)
Creatinine, Ser: 0.53 mg/dL (ref 0.50–1.10)
Glucose, Bld: 89 mg/dL (ref 70–99)
POTASSIUM: 3.9 meq/L (ref 3.7–5.3)
SODIUM: 138 meq/L (ref 137–147)
Total Protein: 6.8 g/dL (ref 6.0–8.3)

## 2014-01-01 LAB — D-DIMER, QUANTITATIVE (NOT AT ARMC): D DIMER QUANT: 1.45 ug{FEU}/mL — AB (ref 0.00–0.48)

## 2014-01-01 NOTE — Patient Instructions (Addendum)
[facility] Discharge Instructions  RECOMMENDATIONS MADE BY THE CONSULTANT AND ANY TEST RESULTS WILL BE SENT TO YOUR REFERRING PHYSICIAN.  EXAM FINDINGS BY THE PHYSICIAN TODAY AND SIGNS OR SYMPTOMS TO REPORT TO CLINIC OR PRIMARY PHYSICIAN: Exam and findings as discussed by Dr. Zigmund DanielFormanek.  Will check some blood work today and if there are any problems we will contact you.  Report unusual bruising or bleeding.  MEDICATIONS PRESCRIBED:  none  INSTRUCTIONS/FOLLOW-UP: Follow-up in 6 months.  Thank you for choosing [facility] to provide your oncology and hematology care.  To afford each patient quality time with our providers, please arrive at least 15 minutes before your scheduled appointment time.  With your help, our goal is to use those 15 minutes to complete the necessary work-up to ensure our physicians have the information they need to help with your evaluation and healthcare recommendations.    Effective January 1st, 2014, we ask that you re-schedule your appointment with our physicians should you arrive 10 or more minutes late for your appointment.  We strive to give you quality time with our providers, and arriving late affects you and other patients whose appointments are after yours.    Again, thank you for choosing [facility].  Our hope is that these requests will decrease the amount of time that you wait before being seen by our physicians.       _____________________________________________________________  Should you have questions after your visit to [facility], please contact our office at [phone] between the hours of 8:30 a.m. and 5:00 p.m.  Voicemails left after 4:30 p.m. will not be returned until the following business day.  For prescription refill requests, have your pharmacy contact our office with your prescription refill request.

## 2014-01-01 NOTE — Progress Notes (Signed)
[facility]  OFFICE PROGRESS NOTE  Tracy Obey, MD [address] [place] [address]  DIAGNOSIS: Thrombophilia - Plan: CBC with Differential, Comprehensive metabolic panel, D-dimer, quantitative  Pregnancy, high-risk - Plan: CBC with Differential, Comprehensive metabolic panel, D-dimer, quantitative  Chief Complaint  Patient presents with  . Thrombophilia  . Intrauterine high risk pregnancy  . Follow-up    CURRENT THERAPY: Aspirin plus Lovenox during intrauterine pregnancy  INTERVAL HISTORY: [name] 23 y.o. female returns for followup of previous pulmonary embolism in may of 2012 currently pregnant since October of 2014. This is her second pregnancy and she experienced no miscarriages in the past. She's ever an episode of epistaxis with no bleeding, hematuria, melena, hematochezia, or hemoptysis. She denies any chest pain, PND, orthopnea, palpitations, lower extremity swelling or redness, skin rash, headache, or seizures. He also denies any fever or night sweats.  Appetite is good with no nausea or vomiting. She continues to work full-time.  MEDICAL HISTORY: Past Medical History  Diagnosis Date  . Hx of blood clots   . 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   . Family history of pulmonary embolism 02/08/2013    Father  . Chlamydia   . HSV-2 (herpes simplex virus 2) infection   . Supervision of high risk pregnancy in first trimester 10/16/2013    On lovenox 120 mg daily in 1 dose  . Abnormal Pap smear   . History of abnormal Pap smear 10/16/2013    INTERIM HISTORY: has PE (pulmonary embolism); Family history of pulmonary embolism; Chlamydia; HSV-2 (herpes simplex virus 2) infection; Dyspareunia; Supervision of high risk pregnancy in first trimester; History of abnormal Pap  smear; and Supervision of high risk pregnancy in second trimester on her problem list.    ALLERGIES:  has No Known Allergies.  MEDICATIONS: has a current medication list which includes the following prescription(s): aspirin, enoxaparin, prenatal vitamin w/fe, fa, and prenat-fefum-fepo-fa-omega 3.  SURGICAL HISTORY:  Past Surgical History  Procedure Laterality Date  . Induced abortion  2012,2013    FAMILY HISTORY: family history includes CAD in her maternal grandmother; Clotting disorder in her father; Diabetes in her maternal grandmother; Hypertension in her maternal grandmother.  SOCIAL HISTORY:  reports that she has never smoked. She has never used smokeless tobacco. She reports that she does not drink alcohol or use illicit drugs.  REVIEW OF SYSTEMS:  Other than that discussed above is noncontributory.  PHYSICAL EXAMINATION: ECOG PERFORMANCE STATUS: 1 - Symptomatic but completely ambulatory  Blood pressure 106/59, pulse 95, temperature 98.1 F (36.7 C), temperature source Oral, resp. rate 16, weight 170 lb (77.111 kg), last menstrual period 08/30/2013.  GENERAL:alert, no distress and comfortable SKIN: skin color, texture, turgor are normal, no rashes or significant lesions. No bruising. EYES: PERLA; Conjunctiva are pink and non-injected, sclera clear OROPHARYNX:no exudate, no erythema on lips, buccal mucosa, or tongue. NECK: supple, thyroid normal size, non-tender, without nodularity. No masses CHEST: Normal AP diameter with no breast masses. LYMPH:  no palpable lymphadenopathy in the cervical, axillary or inguinal LUNGS: clear to auscultation and percussion with normal breathing effort HEART: regular rate & rhythm and no murmurs. ABDOMEN:abdomen soft, non-tender and normal bowel sounds. Intrauterine pregnancy estimated 4-5 months. MUSCULOSKELETAL:no cyanosis of digits and no clubbing. Range of motion  normal.  NEURO: alert & oriented x 3 with fluent speech, no focal  motor/sensory deficits   LABORATORY DATA: Routine Prenatal on 12/20/2013  Component Date Value Range Status  . Glucose, UA 12/20/2013 neg   Final  . Ketones, UA 12/20/2013 neg   Final  . Blood, UA 12/20/2013 neg   Final  . Protein, UA 12/20/2013 neg   Final  . Nitrite, UA 12/20/2013 neg   Final  . Leukocytes, UA 12/20/2013 Negative   Final  Orders Only on 12/20/2013  Component Date Value Range Status  . Interpretation 12/20/2013 SEE NOTE   Final   Comment: SCREEN NEGATIVE FOR OPEN NTD, DOWN SYNDROME AND TRISOMY 18.                          NT WAS USED IN THE RISK CALCULATIONS.  Marland Kitchen Rish for ONTD 12/20/2013 <1:5000   Final  . Age risk Down Syndrome 12/20/2013 1:1100   Final  . MSS Down Syndrome 12/20/2013 <1:5000  <1:270 Final  . MSS Trisomy 18 Risk 12/20/2013 <1:5000  <1:100 Final  . Calculated Gestational Age 70/14/2015 16.3   Final   Comment: Crown rump length (CRL) was used to calculate gestational age.  EDD,                          if provided, was not used for gestational age dating.  . AFP, Serum 12/20/2013 37.5   Final  . AFP MoM 12/20/2013 0.94   Final   Comment:                                                 Reference Range:                                                                          <2.50                                                                          IDD        <1.90                                                                          TWINS      <4.00  TWINS IDD  <3.50                                                                          TRIPLETS   <4.50  . hCG, Serum 12/20/2013 35.8   Final  . hCG MoM 12/20/2013 0.95   Final  . Estriol, Free 12/20/2013 0.94   Final  . Estriol Mom 12/20/2013 1.04   Final  . Inhibin A Dimeric 12/20/2013 149   Final  . Inhibin A MoM 12/20/2013 0.89   Final  . PAPP-A 12/20/2013 1241   Final  . PAPP-A MoM 12/20/2013 1.02   Final  . NT  MoM 12/20/2013 0.92   Final   Comment: The maternal serum screening results indicate a lower risk of trisomy                          21 in this pregnancy. The nasal bone was assessed via ultrasound and                          was present. The combined risk is therefore likely to be less than the                          calculated risk. Other findings later in the pregnancy may change the                          risk.                          Nasal bone assessment is best accomplished through a fetal ultrasound                          performed between 11 weeks 0 days through 13 weeks 6 days. In                          assessing the risk for aneuploidy, the evaluation of the maternal                          serum markers plus the nuchal thickness measurement is calculated                          first. Any potential change to the patient's risk for aneuploidy                          depends on the nuchal thickness, crown-rump length, and the ethnic                          origin, and therefore the values generated by the algorithm itself                          will not change. Additional information about the assessment of the  fetal nasal bone may be found on the [employer] website                          at                          http://www.http://www.rodriguez-pope.com/fetalmedicine.com/fmf/training-certification/certificates-o                          f-compe tence/11-13-week-scan/assessment-of-the-nasal-bone/                          The Integrated Screen combines a nuchal translucency measurement and                          PAPP-A in the first trimester with AFP, unconjugated estriol, intact                          hCG and Inhibin A in the second trimester. This provides a useful                          screening test for detection of open neural tube defects, Down                          syndrome and Trisomy 18. It should be noted that normal results can                           never guarantee the birth of a normal baby and that 2 to 3 percent of                          newborns have some type of physical or mental defect, many of which                          are undetectable through any known prenatal diagnostic technique.                                                     This is a screening test, not a diagnostic test.                                                     This risk assessment is based on demographic data provided by the                          ordering physician. Please notify the laboratory promptly if any data                          are incorrect.  If you have questions concerning this report:                          For clinical consultation, call [phone];                          For technical questions, call [phone];                          For recalculations, fax to [phone].                                                                                This test was developed and its performance characteristics have been                          determined by [employer], [facility]                          [place]. Performance characteristics refer to the analytical                          performance of the test.  . Estimated Date of Delivery 12/20/2013 06/06/2014   Final  . Nuchal Translucency 12/20/2013 1.33   Final  . Crown Rump Length 12/20/2013 63.5   Final  . Ultrasound date 12/20/2013 11/24/2013   Final  . Nasal Bone 12/20/2013 PRESENT   Final  . Mother's Ethnic Origin 12/20/2013 BLACK   Final  . Insulin Dependent Diabetic 12/20/2013 NO   Final  . Repeat Specimen 12/20/2013 NOT GIVEN   Final  . Number of fetuses 12/20/2013 1   Final  . History of Neural Tube Defects 12/20/2013 NO   Final  . Twin B Nasal Bone 12/20/2013 NOT GIVEN   Final   Comment:                                                        For additional information, please refer to                          http://education.questdiagnostics.com/faq/FAQ92                          (This link is being provided for informational/educational purposes                          only.)                               PATHOLOGY: No new pathology.  Urinalysis    Component Value Date/Time   COLORURINE YELLOW 10/16/2013 1039   APPEARANCEUR CLEAR 10/16/2013 1039   LABSPEC  1.017 10/16/2013 1039   PHURINE 6.5 10/16/2013 1039   GLUCOSEU NEG 10/16/2013 1039   HGBUR NEG 10/16/2013 1039   BILIRUBINUR NEG 10/16/2013 1039   KETONESUR NEG 10/16/2013 1039   PROTEINUR NEG 10/16/2013 1039   UROBILINOGEN 0.2 10/16/2013 1039   NITRITE neg 12/20/2013 1547   NITRITE NEG 10/16/2013 1039   LEUKOCYTESUR Negative 12/20/2013 1547    RADIOGRAPHIC STUDIES: No results found.  ASSESSMENT:  #1. High-risk intrauterine pregnancy with previous history of pulmonary embolism, currently on Lovenox +81 mg aspirin daily. #2. History of pulmonary embolism in 2012, status post 12 months of therapy with anticoagulation, no evidence of congenital or acquired thrombophilia syndrome.   PLAN:  #1. D-dimer long with homocystine level. #2. Followup in 6 months. #3. Discontinue aspirin one week prior to delivery. Discontinue Lovenox 24 hours before delivery.   All questions were answered. The patient knows to call the clinic with any problems, questions or concerns. We can certainly see the patient much sooner if necessary.   I spent 25 minutes counseling the patient face to face. The total time spent in the appointment was 30 minutes.    Maurilio Lovely, MD 01/01/2014 2:22 PM

## 2014-01-01 NOTE — Progress Notes (Signed)
[name] presented for [employer]. Labs per MD order drawn via Peripheral Line 23 gauge needle inserted in right AC  Good blood return present. Procedure without incident.  Needle removed intact. Patient tolerated procedure well.

## 2014-01-01 NOTE — Addendum Note (Signed)
Addended by: Evelena LeydenBURGESS, Jaymari Cromie S on: 01/01/2014 03:02 PM   Modules accepted: Orders

## 2014-01-11 ENCOUNTER — Other Ambulatory Visit: Payer: Self-pay | Admitting: Obstetrics and Gynecology

## 2014-01-11 DIAGNOSIS — O09299 Supervision of pregnancy with other poor reproductive or obstetric history, unspecified trimester: Secondary | ICD-10-CM

## 2014-01-11 DIAGNOSIS — Z1389 Encounter for screening for other disorder: Secondary | ICD-10-CM

## 2014-01-17 ENCOUNTER — Ambulatory Visit (INDEPENDENT_AMBULATORY_CARE_PROVIDER_SITE_OTHER): Payer: Medicaid Other

## 2014-01-17 ENCOUNTER — Encounter: Payer: Medicaid Other | Admitting: Women's Health

## 2014-01-17 DIAGNOSIS — Z1389 Encounter for screening for other disorder: Secondary | ICD-10-CM

## 2014-01-17 DIAGNOSIS — O09299 Supervision of pregnancy with other poor reproductive or obstetric history, unspecified trimester: Secondary | ICD-10-CM

## 2014-01-17 NOTE — Progress Notes (Signed)
U/S20+0wks-single active fetus meas c/w dates, fluid wnl, posterior placenta Gr 0, cx appears closed(3.6cm), no major abnl noted, female fetus, FHR-153 bpm

## 2014-03-07 ENCOUNTER — Encounter: Payer: Medicaid Other | Admitting: Women's Health

## 2014-03-14 ENCOUNTER — Ambulatory Visit (INDEPENDENT_AMBULATORY_CARE_PROVIDER_SITE_OTHER): Payer: Medicaid Other | Admitting: Women's Health

## 2014-03-14 ENCOUNTER — Encounter: Payer: Self-pay | Admitting: Women's Health

## 2014-03-14 VITALS — BP 100/70 | Wt 187.0 lb

## 2014-03-14 DIAGNOSIS — Z86711 Personal history of pulmonary embolism: Secondary | ICD-10-CM

## 2014-03-14 DIAGNOSIS — O0990 Supervision of high risk pregnancy, unspecified, unspecified trimester: Secondary | ICD-10-CM

## 2014-03-14 DIAGNOSIS — O98519 Other viral diseases complicating pregnancy, unspecified trimester: Secondary | ICD-10-CM

## 2014-03-14 DIAGNOSIS — B009 Herpesviral infection, unspecified: Secondary | ICD-10-CM

## 2014-03-14 DIAGNOSIS — Z331 Pregnant state, incidental: Secondary | ICD-10-CM

## 2014-03-14 DIAGNOSIS — Z1389 Encounter for screening for other disorder: Secondary | ICD-10-CM

## 2014-03-14 DIAGNOSIS — O09299 Supervision of pregnancy with other poor reproductive or obstetric history, unspecified trimester: Secondary | ICD-10-CM

## 2014-03-14 DIAGNOSIS — O2390 Unspecified genitourinary tract infection in pregnancy, unspecified trimester: Secondary | ICD-10-CM

## 2014-03-14 LAB — POCT URINALYSIS DIPSTICK
Blood, UA: NEGATIVE
Glucose, UA: NEGATIVE
Ketones, UA: NEGATIVE
Leukocytes, UA: NEGATIVE
NITRITE UA: NEGATIVE
Protein, UA: NEGATIVE

## 2014-03-14 NOTE — Patient Instructions (Signed)
You will have your sugar test next visit.  Please do not eat or drink anything after midnight the night before you come, not even water.  You will be here for at least two hours.     Third Trimester of Pregnancy The third trimester is from week 29 through week 42, months 7 through 9. The third trimester is a time when the fetus is growing rapidly. At the end of the ninth month, the fetus is about 20 inches in length and weighs 6 10 pounds.  BODY CHANGES Your body goes through many changes during pregnancy. The changes vary from woman to woman.   Your weight will continue to increase. You can expect to gain 25 35 pounds (11 16 kg) by the end of the pregnancy.  You may begin to get stretch marks on your hips, abdomen, and breasts.  You may urinate more often because the fetus is moving lower into your pelvis and pressing on your bladder.  You may develop or continue to have heartburn as a result of your pregnancy.  You may develop constipation because certain hormones are causing the muscles that push waste through your intestines to slow down.  You may develop hemorrhoids or swollen, bulging veins (varicose veins).  You may have pelvic pain because of the weight gain and pregnancy hormones relaxing your joints between the bones in your pelvis. Back aches may result from over exertion of the muscles supporting your posture.  Your breasts will continue to grow and be tender. A yellow discharge may leak from your breasts called colostrum.  Your belly button may stick out.  You may feel short of breath because of your expanding uterus.  You may notice the fetus "dropping," or moving lower in your abdomen.  You may have a bloody mucus discharge. This usually occurs a few days to a week before labor begins.  Your cervix becomes thin and soft (effaced) near your due date. WHAT TO EXPECT AT YOUR PRENATAL EXAMS  You will have prenatal exams every 2 weeks until week 36. Then, you will have  weekly prenatal exams. During a routine prenatal visit:  You will be weighed to make sure you and the fetus are growing normally.  Your blood pressure is taken.  Your abdomen will be measured to track your baby's growth.  The fetal heartbeat will be listened to.  Any test results from the previous visit will be discussed.  You may have a cervical check near your due date to see if you have effaced. At around 36 weeks, your caregiver will check your cervix. At the same time, your caregiver will also perform a test on the secretions of the vaginal tissue. This test is to determine if a type of bacteria, Group B streptococcus, is present. Your caregiver will explain this further. Your caregiver may ask you:  What your birth plan is.  How you are feeling.  If you are feeling the baby move.  If you have had any abnormal symptoms, such as leaking fluid, bleeding, severe headaches, or abdominal cramping.  If you have any questions. Other tests or screenings that may be performed during your third trimester include:  Blood tests that check for low iron levels (anemia).  Fetal testing to check the health, activity level, and growth of the fetus. Testing is done if you have certain medical conditions or if there are problems during the pregnancy. FALSE LABOR You may feel small, irregular contractions that eventually go away. These are called   Braxton Hicks contractions, or false labor. Contractions may last for hours, days, or even weeks before true labor sets in. If contractions come at regular intervals, intensify, or become painful, it is best to be seen by your caregiver.  SIGNS OF LABOR   Menstrual-like cramps.  Contractions that are 5 minutes apart or less.  Contractions that start on the top of the uterus and spread down to the lower abdomen and back.  A sense of increased pelvic pressure or back pain.  A watery or bloody mucus discharge that comes from the vagina. If you have  any of these signs before the 37th week of pregnancy, call your caregiver right away. You need to go to the hospital to get checked immediately. HOME CARE INSTRUCTIONS   Avoid all smoking, herbs, alcohol, and unprescribed drugs. These chemicals affect the formation and growth of the baby.  Follow your caregiver's instructions regarding medicine use. There are medicines that are either safe or unsafe to take during pregnancy.  Exercise only as directed by your caregiver. Experiencing uterine cramps is a good sign to stop exercising.  Continue to eat regular, healthy meals.  Wear a good support bra for breast tenderness.  Do not use hot tubs, steam rooms, or saunas.  Wear your seat belt at all times when driving.  Avoid raw meat, uncooked cheese, cat litter boxes, and soil used by cats. These carry germs that can cause birth defects in the baby.  Take your prenatal vitamins.  Try taking a stool softener (if your caregiver approves) if you develop constipation. Eat more high-fiber foods, such as fresh vegetables or fruit and whole grains. Drink plenty of fluids to keep your urine clear or pale yellow.  Take warm sitz baths to soothe any pain or discomfort caused by hemorrhoids. Use hemorrhoid cream if your caregiver approves.  If you develop varicose veins, wear support hose. Elevate your feet for 15 minutes, 3 4 times a day. Limit salt in your diet.  Avoid heavy lifting, wear low heal shoes, and practice good posture.  Rest a lot with your legs elevated if you have leg cramps or low back pain.  Visit your dentist if you have not gone during your pregnancy. Use a soft toothbrush to brush your teeth and be gentle when you floss.  A sexual relationship may be continued unless your caregiver directs you otherwise.  Do not travel far distances unless it is absolutely necessary and only with the approval of your caregiver.  Take prenatal classes to understand, practice, and ask questions  about the labor and delivery.  Make a trial run to the hospital.  Pack your hospital bag.  Prepare the baby's nursery.  Continue to go to all your prenatal visits as directed by your caregiver. SEEK MEDICAL CARE IF:  You are unsure if you are in labor or if your water has broken.  You have dizziness.  You have mild pelvic cramps, pelvic pressure, or nagging pain in your abdominal area.  You have persistent nausea, vomiting, or diarrhea.  You have a bad smelling vaginal discharge.  You have pain with urination. SEEK IMMEDIATE MEDICAL CARE IF:   You have a fever.  You are leaking fluid from your vagina.  You have spotting or bleeding from your vagina.  You have severe abdominal cramping or pain.  You have rapid weight loss or gain.  You have shortness of breath with chest pain.  You notice sudden or extreme swelling of your face, hands, ankles,   feet, or legs.  You have not felt your baby move in over an hour.  You have severe headaches that do not go away with medicine.  You have vision changes. Document Released: 11/17/2001 Document Revised: 07/26/2013 Document Reviewed: 01/24/2013 Exit[employer] Patient Information 2014 [employer].  

## 2014-03-14 NOTE — Progress Notes (Signed)
No visits since 20wks d/t had to reschedule 1, and just forgot to reschedule again. Reports good fm. Denies uc's, lof, vb, uti s/s.  No complaints.  Reviewed ptl s/s, fkc.  All questions answered. F/U asap for pn2, then 4wks for next visit.

## 2014-03-15 ENCOUNTER — Other Ambulatory Visit: Payer: Medicaid Other

## 2014-03-27 ENCOUNTER — Other Ambulatory Visit: Payer: Medicaid Other

## 2014-04-02 ENCOUNTER — Other Ambulatory Visit: Payer: Medicaid Other

## 2014-04-05 ENCOUNTER — Other Ambulatory Visit: Payer: Medicaid Other

## 2014-04-05 DIAGNOSIS — Z3480 Encounter for supervision of other normal pregnancy, unspecified trimester: Secondary | ICD-10-CM

## 2014-04-05 LAB — CBC
HCT: 32.4 % — ABNORMAL LOW (ref 36.0–46.0)
Hemoglobin: 10.8 g/dL — ABNORMAL LOW (ref 12.0–15.0)
MCH: 26.6 pg (ref 26.0–34.0)
MCHC: 33.3 g/dL (ref 30.0–36.0)
MCV: 79.8 fL (ref 78.0–100.0)
Platelets: 235 10*3/uL (ref 150–400)
RBC: 4.06 MIL/uL (ref 3.87–5.11)
RDW: 14.9 % (ref 11.5–15.5)
WBC: 7.7 10*3/uL (ref 4.0–10.5)

## 2014-04-06 LAB — GLUCOSE TOLERANCE, 2 HOURS W/ 1HR
GLUCOSE, 2 HOUR: 93 mg/dL (ref 70–139)
Glucose, 1 hour: 124 mg/dL (ref 70–170)
Glucose, Fasting: 79 mg/dL (ref 70–99)

## 2014-04-06 LAB — RPR

## 2014-04-06 LAB — HIV ANTIBODY (ROUTINE TESTING W REFLEX): HIV 1&2 Ab, 4th Generation: NONREACTIVE

## 2014-04-06 LAB — HSV 2 ANTIBODY, IGG: HSV 2 Glycoprotein G Ab, IgG: 4.25 IV — ABNORMAL HIGH

## 2014-04-06 LAB — ANTIBODY SCREEN: Antibody Screen: NEGATIVE

## 2014-04-11 ENCOUNTER — Encounter: Payer: Self-pay | Admitting: Advanced Practice Midwife

## 2014-04-11 ENCOUNTER — Ambulatory Visit (INDEPENDENT_AMBULATORY_CARE_PROVIDER_SITE_OTHER): Payer: Medicaid Other | Admitting: Advanced Practice Midwife

## 2014-04-11 VITALS — BP 120/70 | Wt 188.5 lb

## 2014-04-11 DIAGNOSIS — Z331 Pregnant state, incidental: Secondary | ICD-10-CM

## 2014-04-11 DIAGNOSIS — O99019 Anemia complicating pregnancy, unspecified trimester: Secondary | ICD-10-CM

## 2014-04-11 DIAGNOSIS — O09299 Supervision of pregnancy with other poor reproductive or obstetric history, unspecified trimester: Secondary | ICD-10-CM

## 2014-04-11 DIAGNOSIS — Z1389 Encounter for screening for other disorder: Secondary | ICD-10-CM

## 2014-04-11 DIAGNOSIS — O98519 Other viral diseases complicating pregnancy, unspecified trimester: Secondary | ICD-10-CM

## 2014-04-11 DIAGNOSIS — O0990 Supervision of high risk pregnancy, unspecified, unspecified trimester: Secondary | ICD-10-CM

## 2014-04-11 DIAGNOSIS — Z86711 Personal history of pulmonary embolism: Secondary | ICD-10-CM

## 2014-04-11 LAB — POCT URINALYSIS DIPSTICK
Blood, UA: NEGATIVE
Glucose, UA: NEGATIVE
Ketones, UA: NEGATIVE
LEUKOCYTES UA: NEGATIVE
Nitrite, UA: NEGATIVE
PROTEIN UA: NEGATIVE

## 2014-04-11 NOTE — Progress Notes (Signed)
No c/o at this time except normal musculoskeletal complaints.  Routine questions about pregnancy answered.  F/U in 2 weeks for Low-risk ob appt .

## 2014-04-23 ENCOUNTER — Telehealth: Payer: Self-pay | Admitting: *Deleted

## 2014-04-23 NOTE — Telephone Encounter (Signed)
Pt states loss mucus plug this am, irregular contractions, +FM, no gush of fluids, some vaginal bleeding after sex noted yesterday but noted today. Pt informed to continue to monitor contractions if 5-10 minutes apart or gush of fluids, or if vaginal bleeding reoccurs [facility]. Explained the difference between [facility] and labor signs. Pt has an appt on Wednesday, May 20,2015. Pt verbalized understanding.

## 2014-04-25 ENCOUNTER — Encounter: Payer: Self-pay | Admitting: Obstetrics & Gynecology

## 2014-04-25 ENCOUNTER — Ambulatory Visit (INDEPENDENT_AMBULATORY_CARE_PROVIDER_SITE_OTHER): Payer: Medicaid Other | Admitting: Obstetrics & Gynecology

## 2014-04-25 VITALS — BP 100/70 | Wt 193.0 lb

## 2014-04-25 DIAGNOSIS — Z331 Pregnant state, incidental: Secondary | ICD-10-CM

## 2014-04-25 DIAGNOSIS — Z3480 Encounter for supervision of other normal pregnancy, unspecified trimester: Secondary | ICD-10-CM

## 2014-04-25 DIAGNOSIS — Z1389 Encounter for screening for other disorder: Secondary | ICD-10-CM

## 2014-04-25 LAB — POCT URINALYSIS DIPSTICK
Blood, UA: NEGATIVE
Glucose, UA: NEGATIVE
Ketones, UA: NEGATIVE
LEUKOCYTES UA: NEGATIVE
NITRITE UA: NEGATIVE
Protein, UA: NEGATIVE

## 2014-04-25 MED ORDER — ACYCLOVIR 400 MG PO TABS: 400.0000 mg | ORAL_TABLET | Freq: Three times a day (TID) | ORAL | Status: DC

## 2014-04-25 NOTE — Progress Notes (Signed)
Sporadically taking lovenox: strongly encouraged to take daily as prescribed  [id number] 7564w0d Estimated Date of Delivery: 06/06/14   BP weight and urine results all reviewed and noted. Patient reports good fetal movement, denies any bleeding and no rupture of membranes symptoms or regular contractions.  Patient is without complaints. All questions were answered.  Follow up in 2 weeks for routine OB appt

## 2014-05-09 ENCOUNTER — Encounter: Payer: Self-pay | Admitting: Advanced Practice Midwife

## 2014-05-09 ENCOUNTER — Ambulatory Visit (INDEPENDENT_AMBULATORY_CARE_PROVIDER_SITE_OTHER): Payer: Medicaid Other | Admitting: Advanced Practice Midwife

## 2014-05-09 VITALS — BP 100/70 | Wt 192.0 lb

## 2014-05-09 DIAGNOSIS — Z3483 Encounter for supervision of other normal pregnancy, third trimester: Secondary | ICD-10-CM

## 2014-05-09 DIAGNOSIS — Z331 Pregnant state, incidental: Secondary | ICD-10-CM

## 2014-05-09 DIAGNOSIS — Z1389 Encounter for screening for other disorder: Secondary | ICD-10-CM

## 2014-05-09 DIAGNOSIS — Z3480 Encounter for supervision of other normal pregnancy, unspecified trimester: Secondary | ICD-10-CM

## 2014-05-09 LAB — POCT URINALYSIS DIPSTICK
Blood, UA: NEGATIVE
Glucose, UA: NEGATIVE
Ketones, UA: NEGATIVE
Leukocytes, UA: NEGATIVE
NITRITE UA: NEGATIVE
PROTEIN UA: NEGATIVE

## 2014-05-09 NOTE — Addendum Note (Signed)
Addended by: Richardson Chiquito on: 05/09/2014 05:01 PM   Modules accepted: Orders

## 2014-05-09 NOTE — Progress Notes (Signed)
No c/o at this time.  Routine questions about pregnancy answered.  F/U in 1 weeks for Low-risk ob appt .

## 2014-05-16 ENCOUNTER — Encounter: Payer: Self-pay | Admitting: Women's Health

## 2014-05-16 ENCOUNTER — Ambulatory Visit (INDEPENDENT_AMBULATORY_CARE_PROVIDER_SITE_OTHER): Payer: Medicaid Other | Admitting: Women's Health

## 2014-05-16 VITALS — BP 100/62 | Wt 194.5 lb

## 2014-05-16 DIAGNOSIS — O0990 Supervision of high risk pregnancy, unspecified, unspecified trimester: Secondary | ICD-10-CM

## 2014-05-16 DIAGNOSIS — O09899 Supervision of other high risk pregnancies, unspecified trimester: Secondary | ICD-10-CM

## 2014-05-16 DIAGNOSIS — I2699 Other pulmonary embolism without acute cor pulmonale: Secondary | ICD-10-CM

## 2014-05-16 DIAGNOSIS — Z331 Pregnant state, incidental: Secondary | ICD-10-CM

## 2014-05-16 DIAGNOSIS — Z1389 Encounter for screening for other disorder: Secondary | ICD-10-CM

## 2014-05-16 DIAGNOSIS — Z3480 Encounter for supervision of other normal pregnancy, unspecified trimester: Secondary | ICD-10-CM

## 2014-05-16 DIAGNOSIS — B009 Herpesviral infection, unspecified: Secondary | ICD-10-CM

## 2014-05-16 DIAGNOSIS — Z7901 Long term (current) use of anticoagulants: Secondary | ICD-10-CM

## 2014-05-16 LAB — POCT URINALYSIS DIPSTICK
GLUCOSE UA: NEGATIVE
Ketones, UA: NEGATIVE
Leukocytes, UA: NEGATIVE
Nitrite, UA: NEGATIVE
Protein, UA: NEGATIVE
RBC UA: NEGATIVE

## 2014-05-16 LAB — OB RESULTS CONSOLE GC/CHLAMYDIA
Chlamydia: NEGATIVE
Gonorrhea: NEGATIVE

## 2014-05-16 NOTE — Patient Instructions (Signed)
Call the office ([phone]) or go to [facility] if:  You begin to have strong, frequent contractions  Your water breaks.  Sometimes it is a big gush of fluid, sometimes it is just a trickle that keeps getting your panties wet or running down your legs  You have vaginal bleeding.  It is normal to have a small amount of spotting if your cervix was checked.   You don't feel your baby moving like normal.  If you don't, get you something to eat and drink and lay down and focus on feeling your baby move.  You should feel at least 10 movements in 2 hours.  If you don't, you should call the office or go to [facility].   Braxton Hicks Contractions Pregnancy is commonly associated with contractions of the uterus throughout the pregnancy. Towards the end of pregnancy (32 to 34 weeks), these contractions (Braxton Hicks) can develop more often and may become more forceful. This is not true labor because these contractions do not result in opening (dilatation) and thinning of the cervix. They are sometimes difficult to tell apart from true labor because these contractions can be forceful and people have different pain tolerances. You should not feel embarrassed if you go to the hospital with false labor. Sometimes, the only way to tell if you are in true labor is for your caregiver to follow the changes in the cervix. How to tell the difference between true and false labor:  False labor.  The contractions of false labor are usually shorter, irregular and not as hard as those of true labor.  They are often felt in the front of the lower abdomen and in the groin.  They may leave with walking around or changing positions while lying down.  They get weaker and are shorter lasting as time goes on.  These contractions are usually irregular.  They do not usually become progressively stronger, regular and closer together as with true labor.  True labor.  Contractions in true labor last 30 to 70  seconds, become very regular, usually become more intense, and increase in frequency.  They do not go away with walking.  The discomfort is usually felt in the top of the uterus and spreads to the lower abdomen and low back.  True labor can be determined by your caregiver with an exam. This will show that the cervix is dilating and getting thinner. If there are no prenatal problems or other health problems associated with the pregnancy, it is completely safe to be sent home with false labor and await the onset of true labor. HOME CARE INSTRUCTIONS   Keep up with your usual exercises and instructions.  Take medications as directed.  Keep your regular prenatal appointment.  Eat and drink lightly if you think you are going into labor.  If BH contractions are making you uncomfortable:  Change your activity position from lying down or resting to walking/walking to resting.  Sit and rest in a tub of warm water.  Drink 2 to 3 glasses of water. Dehydration may cause B-H contractions.  Do slow and deep breathing several times an hour. SEEK IMMEDIATE MEDICAL CARE IF:   Your contractions continue to become stronger, more regular, and closer together.  You have a gushing, burst or leaking of fluid from the vagina.  An oral temperature above 102 F (38.9 C) develops.  You have passage of blood-tinged mucus.  You develop vaginal bleeding.  You develop continuous belly (abdominal) pain.  You   have low back pain that you never had before.  You feel the baby's head pushing down causing pelvic pressure.  The baby is not moving as much as it used to. Document Released: 11/23/2005 Document Revised: 02/15/2012 Document Reviewed: 09/04/2013 ExitCare Patient Information 2014 [employer].  

## 2014-05-16 NOTE — Progress Notes (Signed)
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  Urinalysis: Negative HPI:  Doing well, taking lovenox and baby asa as directed now BP, weight, and urine results all reviewed and noted.  Reports good fm. Denies regular uc's, lof, vb, uti s/s. No complaints.  Fundal Height:  37 Fetal Heart rate:  142 Edema:  none GBS collected Requested SVE: 1/th/-3, undeterminable presentation- felt fingers Informal bs u/s: vtx confirmed  Reviewed labor s/s, fkc. All questions were answered. Assessment: [redacted]w[redacted]d h/o PE, currently on anticoagulant therapy Medication(s) Plans:  Continue lovenox 120mg  daily and baby asa daily Treatment Plan:  Continue weekly high-risk ob appts Follow up in 1wk for high-risk OB appt

## 2014-05-17 LAB — GC/CHLAMYDIA PROBE AMP
CT Probe RNA: NEGATIVE
GC PROBE AMP APTIMA: NEGATIVE

## 2014-05-18 LAB — STREP B DNA PROBE: GBSP: NOT DETECTED

## 2014-05-21 ENCOUNTER — Encounter: Payer: Self-pay | Admitting: Women's Health

## 2014-05-23 ENCOUNTER — Encounter: Payer: Self-pay | Admitting: Obstetrics and Gynecology

## 2014-05-23 ENCOUNTER — Ambulatory Visit (INDEPENDENT_AMBULATORY_CARE_PROVIDER_SITE_OTHER): Payer: Medicaid Other | Admitting: Obstetrics and Gynecology

## 2014-05-23 VITALS — BP 120/76 | Wt 194.0 lb

## 2014-05-23 DIAGNOSIS — Z86711 Personal history of pulmonary embolism: Secondary | ICD-10-CM

## 2014-05-23 DIAGNOSIS — Z1389 Encounter for screening for other disorder: Secondary | ICD-10-CM

## 2014-05-23 DIAGNOSIS — O0990 Supervision of high risk pregnancy, unspecified, unspecified trimester: Secondary | ICD-10-CM

## 2014-05-23 DIAGNOSIS — Z331 Pregnant state, incidental: Secondary | ICD-10-CM

## 2014-05-23 DIAGNOSIS — O09899 Supervision of other high risk pregnancies, unspecified trimester: Secondary | ICD-10-CM

## 2014-05-23 LAB — POCT URINALYSIS DIPSTICK
Blood, UA: NEGATIVE
GLUCOSE UA: NEGATIVE
Ketones, UA: NEGATIVE
Leukocytes, UA: NEGATIVE
NITRITE UA: NEGATIVE

## 2014-05-23 NOTE — Progress Notes (Signed)
High Risk Pregnancy Diagnosis(es):   History of PE  G4P1021 7523w0d Estimated Date of Delivery: 06/06/14  Blood pressure 120/76, weight 194 lb (87.998 kg), last menstrual period 08/30/2013.  Urinalysis: Negative HPI: Patient currently takes Lovenox 120 mg daily. She has been compliant with this medication.  BP weight and urine results all reviewed and noted. Patient reports good fetal movement, denies any bleeding and no rupture of membranes symptoms or regular contractions.  Fundal Height:  37 Fetal Heart rate:  146 Edema:  Minimal   Patient is without complaints.  Assessment:  6023w0d,   History of PE , ? Protien S deficiency When not pregnant, with current Lovenox at 120 mg/d,                            Will convert to Heparin 10,000 units SQ bid, to allow easier discontinuation when she goes in to labor.   Medication(s) Plans:  D/c Lovenox, switch to Heparin 10,000 u sq bid, pt to stop at onset of contraction. Will require restart of Lovenox at therapeutic dosing postpartum x 6 wks.  Treatment Plan:  As above  Follow up in 1 weeks for OB appt,

## 2014-05-23 NOTE — Progress Notes (Signed)
Pt denies any problems or concerns at this time.  

## 2014-05-30 ENCOUNTER — Ambulatory Visit (INDEPENDENT_AMBULATORY_CARE_PROVIDER_SITE_OTHER): Payer: Medicaid Other | Admitting: Obstetrics and Gynecology

## 2014-05-30 ENCOUNTER — Encounter: Payer: Self-pay | Admitting: Obstetrics and Gynecology

## 2014-05-30 ENCOUNTER — Telehealth: Payer: Self-pay | Admitting: Obstetrics and Gynecology

## 2014-05-30 VITALS — BP 112/66 | Wt 195.0 lb

## 2014-05-30 DIAGNOSIS — O0993 Supervision of high risk pregnancy, unspecified, third trimester: Secondary | ICD-10-CM

## 2014-05-30 DIAGNOSIS — O09899 Supervision of other high risk pregnancies, unspecified trimester: Secondary | ICD-10-CM

## 2014-05-30 DIAGNOSIS — Z1389 Encounter for screening for other disorder: Secondary | ICD-10-CM

## 2014-05-30 DIAGNOSIS — Z86711 Personal history of pulmonary embolism: Secondary | ICD-10-CM

## 2014-05-30 DIAGNOSIS — Z331 Pregnant state, incidental: Secondary | ICD-10-CM

## 2014-05-30 LAB — POCT URINALYSIS DIPSTICK
Blood, UA: NEGATIVE
Glucose, UA: NEGATIVE
Ketones, UA: NEGATIVE
LEUKOCYTES UA: NEGATIVE
Nitrite, UA: NEGATIVE
Protein, UA: NEGATIVE

## 2014-05-30 MED ORDER — HEPARIN SODIUM (PORCINE) 10000 UNIT/ML IJ SOLN: 10000.0000 [IU] | Freq: Two times a day (BID) | INTRAMUSCULAR | Status: DC

## 2014-05-30 NOTE — Addendum Note (Signed)
Addended by: Tilda BurrowFERGUSON, Paradise Vensel V on: 05/30/2014 04:45 PM   Modules accepted: Orders

## 2014-05-30 NOTE — Progress Notes (Signed)
Pt denies any problems or concerns at this time.  

## 2014-05-30 NOTE — Progress Notes (Signed)
High Risk Pregnancy Diagnosis(es):   History of PE  G4P1021 612w0d Estimated Date of Delivery: 06/06/14  Blood pressure 112/66, weight 195 lb (88.451 kg), last menstrual period 08/30/2013.  Urinalysis: Negative  HPI: Patient presents for routine prenatal visit today. She denies any concerns at this time. She denies vaginal bleeding or rupture of fluids.   BP weight and urine results all reviewed. Patient reports good fetal movement, denies any bleeding and no rupture of membranes symptoms or regular contractions.  Fundal Height:  37 cm Fetal Heart rate:  144 bpm Edema:  Minimal   Patient is without complaints.  Assessment:  272w0d,     Medication(s) Plans:  Heparin 10,000 units subcutaneous BID until delivery                                     D/c Lovenox upon obtaining Heparin.  Treatment Plan:  Epidural could be considered when PTT is less than 40 , discussed with Dr Rodman Pickleassidy today, she will place note in pt chart.  Follow up in 1 week for OB appt,

## 2014-05-30 NOTE — Progress Notes (Signed)
Spoke with Dr Emelda FearFerguson today regarding [name] and her anticoagulation. She has a history of DVT, has been on therapeutic lovenox during pregnancy, and is being transitioned to Heparin until delivery now that she is 39 weeks.  She is planning epidural.  We discussed that since she is on 10,000 units twice daily SQ, we will need to check an aPTT, and she will be eligible for epidural when her aPTT is below 40 (as long as her platelet count is also appropriate).

## 2014-05-30 NOTE — Telephone Encounter (Signed)
[facility] can not get Heparin injection, called [facility] and they can, tried to contact the pt about the pharmacy change and number is not working.

## 2014-05-30 NOTE — Telephone Encounter (Signed)
Attempted to contact pt multiple times today after visit, but have been unable to reach the pt to inform her of the pharmacy change. Will try again tomorrow.

## 2014-05-31 NOTE — Telephone Encounter (Signed)
Pt's number not working, I called pt's mother's number and LMOM for Suni to call me back.

## 2014-05-31 NOTE — Telephone Encounter (Signed)
Spoke with pt letting her know she can get the Heparin Injection at [facility]. JSY

## 2014-06-06 ENCOUNTER — Ambulatory Visit (INDEPENDENT_AMBULATORY_CARE_PROVIDER_SITE_OTHER): Payer: Medicaid Other | Admitting: Obstetrics & Gynecology

## 2014-06-06 ENCOUNTER — Encounter: Payer: Self-pay | Admitting: Obstetrics & Gynecology

## 2014-06-06 VITALS — BP 100/70 | Wt 198.0 lb

## 2014-06-06 DIAGNOSIS — Z331 Pregnant state, incidental: Secondary | ICD-10-CM

## 2014-06-06 DIAGNOSIS — Z3483 Encounter for supervision of other normal pregnancy, third trimester: Secondary | ICD-10-CM

## 2014-06-06 DIAGNOSIS — Z1389 Encounter for screening for other disorder: Secondary | ICD-10-CM

## 2014-06-06 DIAGNOSIS — Z3480 Encounter for supervision of other normal pregnancy, unspecified trimester: Secondary | ICD-10-CM

## 2014-06-06 DIAGNOSIS — Z7901 Long term (current) use of anticoagulants: Secondary | ICD-10-CM

## 2014-06-06 DIAGNOSIS — O09899 Supervision of other high risk pregnancies, unspecified trimester: Secondary | ICD-10-CM

## 2014-06-06 DIAGNOSIS — O09299 Supervision of pregnancy with other poor reproductive or obstetric history, unspecified trimester: Secondary | ICD-10-CM

## 2014-06-06 DIAGNOSIS — Z86711 Personal history of pulmonary embolism: Secondary | ICD-10-CM

## 2014-06-06 LAB — POCT URINALYSIS DIPSTICK
Blood, UA: NEGATIVE
Glucose, UA: NEGATIVE
Ketones, UA: NEGATIVE
LEUKOCYTES UA: NEGATIVE
Nitrite, UA: NEGATIVE
PROTEIN UA: NEGATIVE

## 2014-06-06 NOTE — Progress Notes (Signed)
[id number] 8260w0d Estimated Date of Delivery: 06/06/14  Blood pressure 100/70, weight 198 lb (89.812 kg), last menstrual period 08/30/2013.   BP weight and urine results all reviewed and noted.  Please refer to the obstetrical flow sheet for the fundal height and fetal heart rate documentation:  Patient reports good fetal movement, denies any bleeding and no rupture of membranes symptoms or regular contractions. Patient is without complaints. All questions were answered.  Plan:  Continued routine obstetrical care, transition to heparin  Induction scheduled for 06/13/2014

## 2014-06-07 ENCOUNTER — Telehealth (HOSPITAL_COMMUNITY): Payer: Self-pay | Admitting: *Deleted

## 2014-06-07 ENCOUNTER — Encounter (HOSPITAL_COMMUNITY): Payer: Self-pay | Admitting: *Deleted

## 2014-06-07 NOTE — Telephone Encounter (Signed)
Preadmission screen  

## 2014-06-11 ENCOUNTER — Inpatient Hospital Stay (HOSPITAL_COMMUNITY)
Admission: AD | Admit: 2014-06-11 | Discharge: 2014-06-14 | DRG: 774 | Disposition: A | Discharge status: Home / Self Care | Payer: Medicaid Other | Source: Ambulatory Visit | Attending: Obstetrics & Gynecology | Admitting: Obstetrics & Gynecology

## 2014-06-11 ENCOUNTER — Encounter (HOSPITAL_COMMUNITY): Payer: Self-pay

## 2014-06-11 ENCOUNTER — Telehealth: Payer: Self-pay | Admitting: Obstetrics & Gynecology

## 2014-06-11 DIAGNOSIS — Z8249 Family history of ischemic heart disease and other diseases of the circulatory system: Secondary | ICD-10-CM

## 2014-06-11 DIAGNOSIS — Z86711 Personal history of pulmonary embolism: Secondary | ICD-10-CM

## 2014-06-11 DIAGNOSIS — O98519 Other viral diseases complicating pregnancy, unspecified trimester: Secondary | ICD-10-CM | POA: Diagnosis present

## 2014-06-11 DIAGNOSIS — O4290 Premature rupture of membranes, unspecified as to length of time between rupture and onset of labor, unspecified weeks of gestation: Secondary | ICD-10-CM | POA: Diagnosis present

## 2014-06-11 DIAGNOSIS — Z833 Family history of diabetes mellitus: Secondary | ICD-10-CM

## 2014-06-11 DIAGNOSIS — O42912 Preterm premature rupture of membranes, unspecified as to length of time between rupture and onset of labor, second trimester: Secondary | ICD-10-CM

## 2014-06-11 DIAGNOSIS — A6000 Herpesviral infection of urogenital system, unspecified: Secondary | ICD-10-CM | POA: Diagnosis present

## 2014-06-11 HISTORY — DX: Other specified abnormal immunological findings in serum: R76.8

## 2014-06-11 NOTE — MAU Note (Signed)
Pt had large gush of clear/brown fluid tonight. Denies vaginal bleeding.

## 2014-06-11 NOTE — Telephone Encounter (Signed)
[facility] was having contractions that were every 10 minutes, they told her there that they did not really have all the resources [facility] with [facility]. The pt was released and stated that [facility] the contractions got closer.

## 2014-06-12 ENCOUNTER — Encounter (HOSPITAL_COMMUNITY): Payer: Self-pay

## 2014-06-12 ENCOUNTER — Encounter (HOSPITAL_COMMUNITY): Payer: Medicaid Other | Admitting: Anesthesiology

## 2014-06-12 ENCOUNTER — Inpatient Hospital Stay (HOSPITAL_COMMUNITY): Payer: Medicaid Other | Admitting: Anesthesiology

## 2014-06-12 DIAGNOSIS — O4290 Premature rupture of membranes, unspecified as to length of time between rupture and onset of labor, unspecified weeks of gestation: Secondary | ICD-10-CM | POA: Diagnosis present

## 2014-06-12 DIAGNOSIS — A6000 Herpesviral infection of urogenital system, unspecified: Secondary | ICD-10-CM | POA: Diagnosis present

## 2014-06-12 DIAGNOSIS — O98519 Other viral diseases complicating pregnancy, unspecified trimester: Secondary | ICD-10-CM

## 2014-06-12 DIAGNOSIS — Z86711 Personal history of pulmonary embolism: Secondary | ICD-10-CM | POA: Diagnosis not present

## 2014-06-12 DIAGNOSIS — Z8249 Family history of ischemic heart disease and other diseases of the circulatory system: Secondary | ICD-10-CM | POA: Diagnosis not present

## 2014-06-12 DIAGNOSIS — Z833 Family history of diabetes mellitus: Secondary | ICD-10-CM | POA: Diagnosis not present

## 2014-06-12 DIAGNOSIS — O99891 Other specified diseases and conditions complicating pregnancy: Secondary | ICD-10-CM | POA: Diagnosis present

## 2014-06-12 LAB — CBC
HCT: 33.4 % — ABNORMAL LOW (ref 36.0–46.0)
HEMOGLOBIN: 11.5 g/dL — AB (ref 12.0–15.0)
MCH: 26.1 pg (ref 26.0–34.0)
MCHC: 34.4 g/dL (ref 30.0–36.0)
MCV: 75.9 fL — ABNORMAL LOW (ref 78.0–100.0)
Platelets: 188 10*3/uL (ref 150–400)
RBC: 4.4 MIL/uL (ref 3.87–5.11)
RDW: 17 % — ABNORMAL HIGH (ref 11.5–15.5)
WBC: 10 10*3/uL (ref 4.0–10.5)

## 2014-06-12 LAB — ABO/RH: ABO/RH(D): A POS

## 2014-06-12 LAB — TYPE AND SCREEN
ABO/RH(D): A POS
ANTIBODY SCREEN: NEGATIVE

## 2014-06-12 LAB — RPR

## 2014-06-12 LAB — APTT: aPTT: 31 seconds (ref 24–37)

## 2014-06-12 LAB — POCT FERN TEST: POCT Fern Test: POSITIVE

## 2014-06-12 MED ORDER — TETANUS-DIPHTH-ACELL PERTUSSIS 5-2.5-18.5 LF-MCG/0.5 IM SUSP
0.5000 mL | Freq: Once | INTRAMUSCULAR | Status: AC
  Administered 2014-06-13: 0.5 mL via INTRAMUSCULAR

## 2014-06-12 MED ORDER — PRENATAL MULTIVITAMIN CH
1.0000 | ORAL_TABLET | Freq: Every day | ORAL | Status: DC
  Administered 2014-06-13 – 2014-06-14 (×2): 1 via ORAL
  Filled 2014-06-12 (×2): qty 1

## 2014-06-12 MED ORDER — PHENYLEPHRINE 40 MCG/ML (10ML) SYRINGE FOR IV PUSH (FOR BLOOD PRESSURE SUPPORT)
80.0000 ug | PREFILLED_SYRINGE | INTRAVENOUS | Status: DC | PRN
  Filled 2014-06-12: qty 2

## 2014-06-12 MED ORDER — ACETAMINOPHEN 325 MG PO TABS: 650.0000 mg | ORAL_TABLET | ORAL | Status: DC | PRN

## 2014-06-12 MED ORDER — LIDOCAINE HCL (PF) 1 % IJ SOLN
INTRAMUSCULAR | Status: DC | PRN
  Administered 2014-06-12 (×4): 4 mL

## 2014-06-12 MED ORDER — ONDANSETRON HCL 4 MG/2ML IJ SOLN: 4.0000 mg | Freq: Four times a day (QID) | INTRAMUSCULAR | Status: DC | PRN

## 2014-06-12 MED ORDER — OXYCODONE-ACETAMINOPHEN 5-325 MG PO TABS
1.0000 | ORAL_TABLET | ORAL | Status: DC | PRN
  Administered 2014-06-13 – 2014-06-14 (×5): 1 via ORAL
  Filled 2014-06-12 (×5): qty 1

## 2014-06-12 MED ORDER — CITRIC ACID-SODIUM CITRATE 334-500 MG/5ML PO SOLN: 30.0000 mL | ORAL | Status: DC | PRN

## 2014-06-12 MED ORDER — LACTATED RINGERS IV SOLN
INTRAVENOUS | Status: DC
  Administered 2014-06-12 (×3): via INTRAVENOUS

## 2014-06-12 MED ORDER — LACTATED RINGERS IV SOLN
500.0000 mL | Freq: Once | INTRAVENOUS | Status: AC
  Administered 2014-06-12: 500 mL via INTRAVENOUS

## 2014-06-12 MED ORDER — FENTANYL 2.5 MCG/ML BUPIVACAINE 1/10 % EPIDURAL INFUSION (WH - ANES)
14.0000 mL/h | INTRAMUSCULAR | Status: DC | PRN
  Administered 2014-06-12 (×4): 14 mL/h via EPIDURAL
  Filled 2014-06-12 (×2): qty 125

## 2014-06-12 MED ORDER — OXYTOCIN 40 UNITS IN LACTATED RINGERS INFUSION - SIMPLE MED
62.5000 mL/h | INTRAVENOUS | Status: DC
  Administered 2014-06-12: 62.5 mL/h via INTRAVENOUS
  Filled 2014-06-12: qty 1000

## 2014-06-12 MED ORDER — WITCH HAZEL-GLYCERIN EX PADS: 1.0000 "application " | MEDICATED_PAD | CUTANEOUS | Status: DC | PRN

## 2014-06-12 MED ORDER — SENNOSIDES-DOCUSATE SODIUM 8.6-50 MG PO TABS
2.0000 | ORAL_TABLET | ORAL | Status: DC
Start: 2014-06-13 — End: 2014-06-14
  Administered 2014-06-12 – 2014-06-13 (×2): 2 via ORAL
  Filled 2014-06-12 (×2): qty 2

## 2014-06-12 MED ORDER — IBUPROFEN 600 MG PO TABS
600.0000 mg | ORAL_TABLET | Freq: Four times a day (QID) | ORAL | Status: DC
  Administered 2014-06-12 – 2014-06-13 (×4): 600 mg via ORAL
  Filled 2014-06-12 (×4): qty 1

## 2014-06-12 MED ORDER — OXYCODONE-ACETAMINOPHEN 5-325 MG PO TABS: 1.0000 | ORAL_TABLET | ORAL | Status: DC | PRN

## 2014-06-12 MED ORDER — ZOLPIDEM TARTRATE 5 MG PO TABS: 5.0000 mg | ORAL_TABLET | Freq: Every evening | ORAL | Status: DC | PRN

## 2014-06-12 MED ORDER — LIDOCAINE HCL (PF) 1 % IJ SOLN
30.0000 mL | INTRAMUSCULAR | Status: DC | PRN
  Filled 2014-06-12: qty 30

## 2014-06-12 MED ORDER — DIBUCAINE 1 % RE OINT
1.0000 "application " | TOPICAL_OINTMENT | RECTAL | Status: DC | PRN
  Filled 2014-06-12: qty 28

## 2014-06-12 MED ORDER — ONDANSETRON HCL 4 MG PO TABS: 4.0000 mg | ORAL_TABLET | ORAL | Status: DC | PRN

## 2014-06-12 MED ORDER — ONDANSETRON HCL 4 MG/2ML IJ SOLN: 4.0000 mg | INTRAMUSCULAR | Status: DC | PRN

## 2014-06-12 MED ORDER — EPHEDRINE 5 MG/ML INJ
10.0000 mg | INTRAVENOUS | Status: DC | PRN
  Filled 2014-06-12: qty 2

## 2014-06-12 MED ORDER — MISOPROSTOL 200 MCG PO TABS
ORAL_TABLET | ORAL | Status: AC
  Administered 2014-06-12: 1000 ug
  Filled 2014-06-12: qty 5

## 2014-06-12 MED ORDER — DIPHENHYDRAMINE HCL 25 MG PO CAPS: 25.0000 mg | ORAL_CAPSULE | Freq: Four times a day (QID) | ORAL | Status: DC | PRN

## 2014-06-12 MED ORDER — DIPHENHYDRAMINE HCL 50 MG/ML IJ SOLN: 12.5000 mg | INTRAMUSCULAR | Status: DC | PRN

## 2014-06-12 MED ORDER — IBUPROFEN 600 MG PO TABS: 600.0000 mg | ORAL_TABLET | Freq: Four times a day (QID) | ORAL | Status: DC | PRN

## 2014-06-12 MED ORDER — BENZOCAINE-MENTHOL 20-0.5 % EX AERO
1.0000 "application " | INHALATION_SPRAY | CUTANEOUS | Status: DC | PRN
  Administered 2014-06-12: 1 via TOPICAL
  Filled 2014-06-12: qty 56

## 2014-06-12 MED ORDER — LACTATED RINGERS IV SOLN: 500.0000 mL | INTRAVENOUS | Status: DC | PRN

## 2014-06-12 MED ORDER — SIMETHICONE 80 MG PO CHEW: 80.0000 mg | CHEWABLE_TABLET | ORAL | Status: DC | PRN

## 2014-06-12 MED ORDER — LANOLIN HYDROUS EX OINT: TOPICAL_OINTMENT | CUTANEOUS | Status: DC | PRN

## 2014-06-12 MED ORDER — PHENYLEPHRINE 40 MCG/ML (10ML) SYRINGE FOR IV PUSH (FOR BLOOD PRESSURE SUPPORT)
80.0000 ug | PREFILLED_SYRINGE | INTRAVENOUS | Status: DC | PRN
  Filled 2014-06-12: qty 2
  Filled 2014-06-12: qty 10

## 2014-06-12 MED ORDER — ENOXAPARIN SODIUM 120 MG/0.8ML ~~LOC~~ SOLN
120.0000 mg | SUBCUTANEOUS | Status: DC
  Administered 2014-06-13 – 2014-06-14 (×2): 120 mg via SUBCUTANEOUS
  Filled 2014-06-12 (×3): qty 0.8

## 2014-06-12 MED ORDER — OXYTOCIN BOLUS FROM INFUSION
500.0000 mL | INTRAVENOUS | Status: DC
  Administered 2014-06-12: 500 mL via INTRAVENOUS

## 2014-06-12 NOTE — H&P (Signed)
Attestation of Attending Supervision of Advanced Practitioner (CNM/NP): Evaluation and management procedures were performed by the Advanced Practitioner under my supervision and collaboration.  I have reviewed the Advanced Practitioner's note and chart, and I agree with the management and plan.  HARRAWAY-SMITH, Courtez Twaddle 7:13 AM

## 2014-06-12 NOTE — Progress Notes (Addendum)
[name] is a 23 y.o. [id number] at 2438w6d admitted for PROM  Subjective: Pt became uncomfortable and requested epidural.  Now, she feels mild intermittent rectal pressure but is comfortable with epidural.  S/O at bedside for support.   Objective: BP 121/67  Pulse 100  Temp(Src) 98 F (36.7 C) (Oral)  Resp 16  Ht 5\' 6"  (1.676 m)  Wt 89.903 kg (198 lb 3.2 oz)  BMI 32.01 kg/m2  SpO2 96%  LMP 08/30/2013      FHT:  FHR: 150 bpm, variability: moderate,  accelerations:  Present,  decelerations:  Present occasional variables UC:   regular, every 2-3 minutes SVE:   Dilation: Lip/rim Effacement (%): 100 Station: 0 Exam by:: Beazer HomesLeftwich Kirby CNM  Labs: Lab Results  Component Value Date   WBC 10.0 06/12/2014   HGB 11.5* 06/12/2014   HCT 33.4* 06/12/2014   MCV 75.9* 06/12/2014   PLT 188 06/12/2014    Assessment / Plan: Spontaneous labor, progressing normally  Labor: Progressing normally Preeclampsia:  n/a Fetal Wellbeing:  Category I Pain Control:  Epidural I/D:  n/a Anticipated MOD:  NSVD  [name], [name] 06/12/2014, 7:15 AM

## 2014-06-12 NOTE — Anesthesia Postprocedure Evaluation (Signed)
Anesthesia Post Note  Patient: [name]  Procedure(s) Performed: * No procedures listed *  Anesthesia type: Epidural  Patient location: Mother/Baby  Post pain: Pain level controlled  Post assessment: Post-op Vital signs reviewed  Last Vitals:  Filed Vitals:   06/12/14 1905  BP: 109/72  Pulse: 99  Temp: 37.2 C  Resp: 20    Post vital signs: Reviewed  Level of consciousness:alert  Complications: No apparent anesthesia complications

## 2014-06-12 NOTE — Progress Notes (Signed)
I have seen and examined this patient and agree with above documentation in the resident's note.   Rulon AbideKeli Maurilio Puryear, M.D. [facility] 06/12/2014 11:49 AM

## 2014-06-12 NOTE — H&P (Signed)
[name] is a 23 y.o. female [phone] @[redacted]w[redacted]d  pt of [facility] presenting for leakage of yellow/clear fluid x1 episode soaking a pad and dripping in puddles on the floor at 10 pm on 7/6.  She reports some painful contractions at home but none that are regular.  She has a high risk pregnancy on Lovenox daily  for hx of unprovoked PE in 2012. She was switched to heparin today and had last dose at 3pm on 7/6.  Her only other complication during this pregnancy was positive serum test for HSV.  She is taking acyclovir as prescribed.  With her previous pregnancy, she was admitted at 8 cm and quickly progressed to complete and had NSVD.  She reports good fetal movement, denies vaginal bleeding, vaginal itching/burning, urinary symptoms, h/a, dizziness, n/v, or fever/chills.     Maternal Medical History:  Reason for admission: Rupture of membranes.  Nausea.  Contractions: Onset was 3-5 hours ago.   Frequency: irregular.   Duration is approximately 1 minute.   Perceived severity is strong.    Fetal activity: Perceived fetal activity is normal.   Last perceived fetal movement was within the past hour.    Prenatal complications: Infection.   Hx unprovoked PE HSV seropositive  Prenatal Complications - Diabetes: none.    OB History   Grav Para Term Preterm Abortions TAB SAB Ect Mult Living   4 1 1  2 2    1      Past Medical History  Diagnosis Date  . Hx of blood clots   . 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   . Family history of pulmonary embolism 02/08/2013    Father  . Chlamydia   . Supervision of high risk pregnancy in first trimester 10/16/2013    On lovenox 120 mg daily in 1 dose  . Abnormal Pap smear   . History of abnormal Pap smear 10/16/2013  . 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  Induced abortion  2012,2013   Family History: family history includes CAD in her maternal grandmother; Clotting disorder in her father; Diabetes in her maternal grandmother; Hypertension in her maternal grandmother. Social History:  reports that she has never smoked. She has never used smokeless tobacco. She reports that she does not drink alcohol or use illicit drugs.   Prenatal Transfer Tool  Maternal Diabetes: No Genetic Screening: Normal Maternal Ultrasounds/Referrals: Normal Fetal Ultrasounds or other Referrals:  None Maternal Substance Abuse:  No Significant Maternal Medications:  Meds include: Other:  Significant Maternal Lab Results:  Lab values include: Group B Strep negative Other Comments:  acylovir  Review of Systems  Constitutional: Negative for fever, chills and malaise/fatigue.  Eyes: Negative for blurred vision.  Respiratory: Negative for cough and shortness of breath.   Cardiovascular: Negative for chest pain.  Gastrointestinal: Positive for abdominal pain. Negative for heartburn, nausea and vomiting.  Genitourinary: Negative for dysuria, urgency and frequency.  Musculoskeletal: Negative.   Neurological: Negative for dizziness and headaches.  Psychiatric/Behavioral: Negative for depression.    Dilation: 3 Effacement (%): 70 Station: -2 Exam by:: Sharen CounterLisa Leftwich Trevino CNM Blood pressure 120/77, pulse 91, temperature 98.7 F (37.1 C), temperature source Oral, resp. rate 18, height 5\' 6"  (1.676 m), weight 89.903 kg (198 lb 3.2 oz), last menstrual  period 08/30/2013. Maternal Exam:  Uterine Assessment: Contraction strength is mild.  Contraction duration is 60 seconds. Contraction frequency is irregular.   Abdomen: Fetal presentation: vertex  Introitus: Ferning test: positive.  Amniotic fluid character: clear.  Pelvis: adequate for delivery.   Cervix: Cervix evaluated by digital exam.     Fetal Exam Fetal Monitor Review: Mode: ultrasound.   Baseline rate: 150.   Variability: moderate (6-25 bpm).   Pattern: accelerations present and no decelerations.    Fetal State Assessment: Category I - tracings are normal.     Physical Exam  Nursing note and vitals reviewed. Constitutional: She is oriented to person, place, and time. She appears well-developed and well-nourished.  Neck: Normal range of motion.  Cardiovascular: Normal rate and regular rhythm.   Respiratory: Effort normal and breath sounds normal.  GI: Soft.  Musculoskeletal: Normal range of motion.  Neurological: She is alert and oriented to person, place, and time. She has normal reflexes.  Skin: Skin is warm and dry.  Psychiatric: She has a normal mood and affect. Her behavior is normal. Judgment and thought content normal.    Prenatal labs: ABO, Rh: A/POS/-- (11/10 1035) Antibody: NEG (04/30 0932) Rubella: 3.14 (11/10 1035) RPR: NON REAC (04/30 0932)  HBsAg: NEGATIVE (11/10 1035)  HIV: NONREACTIVE (04/30 0932)  GBS: NOT DETECTED (06/10 1614)   Assessment/Plan: PROM @ 7259w6d Hx unprovoked PE HSV seropositive on acylovir GBS negative  [facility]  Discussed use of Pitocin if no labor onset within a few hours Anticipate NSVD   LEFTWICH-Trevino, [name] 06/12/2014, 1:24 AM

## 2014-06-12 NOTE — Anesthesia Preprocedure Evaluation (Signed)
Anesthesia Evaluation  Patient identified by MRN, date of birth, ID band Patient awake    Reviewed: Allergy & Precautions, H&P , NPO status , Patient's Chart, lab work & pertinent test results, reviewed documented beta blocker date and time   History of Anesthesia Complications Negative for: history of anesthetic complications  Airway Mallampati: I TM Distance: >3 FB Neck ROM: full    Dental  (+) Teeth Intact   Pulmonary PE (2012 - was on lovenox until 7/2, took first dose of heparin on 7/6, last dose was 7/6 at 1500.  ) breath sounds clear to auscultation        Cardiovascular negative cardio ROS  Rhythm:regular Rate:Normal     Neuro/Psych negative neurological ROS  negative psych ROS   GI/Hepatic negative GI ROS, Neg liver ROS,   Endo/Other  negative endocrine ROS  Renal/GU negative Renal ROS     Musculoskeletal   Abdominal   Peds  Hematology negative hematology ROS (+)   Anesthesia Other Findings   Reproductive/Obstetrics (+) Pregnancy                           Anesthesia Physical Anesthesia Plan  ASA: II  Anesthesia Plan: Epidural   Post-op Pain Management:    Induction:   Airway Management Planned:   Additional Equipment:   Intra-op Plan:   Post-operative Plan:   Informed Consent: I have reviewed the patients History and Physical, chart, labs and discussed the procedure including the risks, benefits and alternatives for the proposed anesthesia with the patient or authorized representative who has indicated his/her understanding and acceptance.     Plan Discussed with:   Anesthesia Plan Comments:         Anesthesia Quick Evaluation

## 2014-06-12 NOTE — Anesthesia Procedure Notes (Signed)
Epidural Patient location during procedure: OB Start time: 06/12/2014 4:00 AM  Staffing Performed by: anesthesiologist   Preanesthetic Checklist Completed: patient identified, site marked, surgical consent, pre-op evaluation, timeout performed, IV checked, risks and benefits discussed and monitors and equipment checked  Epidural Patient position: sitting Prep: site prepped and draped and DuraPrep Patient monitoring: continuous pulse ox and blood pressure Approach: midline Injection technique: LOR air  Needle:  Needle type: Tuohy  Needle gauge: 17 G Needle length: 9 cm and 9 Needle insertion depth: 6 cm Catheter type: closed end flexible Catheter size: 19 Gauge Catheter at skin depth: 11 cm Test dose: negative  Assessment Events: blood not aspirated, injection not painful, no injection resistance, negative IV test and no paresthesia  Additional Notes Discussed risk of headache, infection, bleeding, nerve injury and failed or incomplete block.  Patient voices understanding and wishes to proceed.  Epidural placed easily on first attempt.  No paresthesia.  Patient tolerated procedure well with no apparent complications.  Jasmine DecemberA. Yaasir Menken, MDReason for block:procedure for pain

## 2014-06-12 NOTE — Progress Notes (Signed)
[name] is a 23 y.o. [id number] at 4964w6d by LMP admitted for PROM  Subjective: Pt. Is comfortable, and feeling slightly more pressure at this point. She says that she feels the contractions slightly. She has no other complaints.   Objective: BP 128/82  Pulse 117  Temp(Src) 98.7 F (37.1 C) (Oral)  Resp 18  Ht 5\' 6"  (1.676 m)  Wt 89.903 kg (198 lb 3.2 oz)  BMI 32.01 kg/m2  SpO2 96%  LMP 08/30/2013   Total I/O In: -  Out: 300 [Urine:300]  FHT:  FHR: 145 bpm, variability: moderate,  accelerations:  Present,  decelerations:  Absent UC:   regular, every 3-4 minutes SVE:   Dilation: Complete Effacement: 100% Station: 0 Demiana Crumbley, MD  Labs: Lab Results  Component Value Date   WBC 10.0 06/12/2014   HGB 11.5* 06/12/2014   HCT 33.4* 06/12/2014   MCV 75.9* 06/12/2014   PLT 188 06/12/2014    Assessment / Plan: Spontaneous labor, progressing normally  Labor: Progressing normally will begin pushing as appropriate.  Preeclampsia:  no signs or symptoms of toxicity Fetal Wellbeing:  Category I Pain Control:  Epidural I/D:  On acyclovir for HSV, otherwise no ppx.  Anticipated MOD:  NSVD  Oneka Parada G 06/12/2014, 10:29 AM

## 2014-06-13 ENCOUNTER — Telehealth: Payer: Self-pay | Admitting: Obstetrics and Gynecology

## 2014-06-13 ENCOUNTER — Inpatient Hospital Stay (HOSPITAL_COMMUNITY): Admission: RE | Admit: 2014-06-13 | Payer: Medicaid Other | Source: Ambulatory Visit

## 2014-06-13 LAB — CBC
HEMATOCRIT: 31.8 % — AB (ref 36.0–46.0)
Hemoglobin: 11 g/dL — ABNORMAL LOW (ref 12.0–15.0)
MCH: 26.4 pg (ref 26.0–34.0)
MCHC: 34.6 g/dL (ref 30.0–36.0)
MCV: 76.3 fL — AB (ref 78.0–100.0)
PLATELETS: 170 10*3/uL (ref 150–400)
RBC: 4.17 MIL/uL (ref 3.87–5.11)
RDW: 17.2 % — AB (ref 11.5–15.5)
WBC: 14.4 10*3/uL — ABNORMAL HIGH (ref 4.0–10.5)

## 2014-06-13 LAB — PROTIME-INR
INR: 0.85 (ref 0.00–1.49)
Prothrombin Time: 11.6 seconds (ref 11.6–15.2)

## 2014-06-13 MED ORDER — WARFARIN - PHARMACIST DOSING INPATIENT: Freq: Every day | Status: DC

## 2014-06-13 MED ORDER — COUMADIN BOOK
Freq: Once | Status: AC
  Administered 2014-06-13: 14:00:00
  Filled 2014-06-13: qty 1

## 2014-06-13 MED ORDER — WARFARIN VIDEO: Freq: Once | Status: DC

## 2014-06-13 MED ORDER — WARFARIN SODIUM 10 MG PO TABS
10.0000 mg | ORAL_TABLET | Freq: Once | ORAL | Status: AC
  Administered 2014-06-13: 10 mg via ORAL
  Filled 2014-06-13: qty 1

## 2014-06-13 NOTE — Progress Notes (Signed)
UR chart review completed.  

## 2014-06-13 NOTE — Lactation Note (Addendum)
This note was copied from the chart of [name]. Lactation Consultation Note  Patient Name: [name] [id number]'s Date: 06/13/2014 Reason for consult: Initial assessment  Baby 25 hours of life. Mom has experience BF first child 5 months. Stopped nursing due to antibiotics prescribed by ED for UTI. Discussed with mom to ask for antibiotic that would not interfere with breast feeding if has another. Her private doctor aware and had already explained to mom. Mom very much wants to exclusively breast feed longer with this child. Mom denies nipple/breast pain. Enc mom to offer lots of STS and feed with cues. Mom already seeing colostrum and reports baby nursing well. [facility] and aware of OP/BFSG and community resources. Enc mom to call for assistance as needed.  Maternal Data Has patient been taught Hand Expression?: Yes Does the patient have breastfeeding experience prior to this delivery?: Yes  Feeding Feeding Type:  (Mom does not want to latch baby at this time.) Length of feed: 20 min  LATCH Score/Interventions                      Lactation Tools Discussed/Used     Consult Status Consult Status: Follow-up Date: 06/14/14 Follow-up type: In-patient    Geralynn OchsWILLIARD, Noemie Devivo 06/13/2014, 1:06 PM

## 2014-06-13 NOTE — Progress Notes (Signed)
Post Partum Day 1 (admitted for SROM) Subjective: no complaints, up ad lib, tolerating PO and + flatus Denies SOB, chest pain or palpations   Objective: Blood pressure 100/65, pulse 82, temperature 98.4 F (36.9 C), temperature source Oral, resp. rate 18, height 5\' 6"  (1.676 m), weight 89.903 kg (198 lb 3.2 oz), last menstrual period 08/30/2013, SpO2 96.00%, unknown if currently breastfeeding.  Physical Exam:  General: alert, cooperative and no distress Lochia: appropriate Uterine Fundus: firm Incision: n/a DVT Evaluation: No evidence of DVT seen on physical exam. No cords or calf tenderness. No significant calf/ankle edema. SCDs in place   Recent Labs  06/12/14 0129 06/13/14 0615  HGB 11.5* 11.0*  HCT 33.4* 31.8*    Assessment/Plan: Plan for discharge tomorrow and Breastfeeding Restarting lovenox today; has questionable protein S deficiency? (hx of unprovoked PE) No signs of acute blood loss, hemodynamically stable  Plans to restart Mirena for contraception   LOS: 2 days   [name], [name] 06/13/2014, 6:52 AM

## 2014-06-13 NOTE — Progress Notes (Signed)
I have seen and examined this patient and I agree with the above. Plan for bridge to Coumadin and [place] [facility]. Cam HaiSHAW, KIMBERLY CNM 8:47 AM 06/13/2014

## 2014-06-13 NOTE — Progress Notes (Signed)
ANTICOAGULATION CONSULT NOTE - Initial Consult  Pharmacy Consult for Coumadin Indication: history of unprovoked PE/post-partum  No Known Allergies  Patient Measurements: Height: 5\' 6"  (167.6 cm) Weight: 198 lb 3.2 oz (89.903 kg) IBW/kg (Calculated) : 59.3  Vital Signs: Temp: 98.1 F (36.7 C) (07/08 0546) Temp src: Oral (07/08 0546) BP: 109/72 mmHg (07/08 0546) Pulse Rate: 73 (07/08 0546)  Labs:  Recent Labs  06/12/14 0129 06/12/14 0247 06/13/14 0615 06/13/14 1030  HGB 11.5*  --  11.0*  --   HCT 33.4*  --  31.8*  --   PLT 188  --  170  --   APTT  --  31  --   --   LABPROT  --   --   --  11.6  INR  --   --   --  0.85    Estimated Creatinine Clearance: 124.5 ml/min (by C-G formula based on Cr of 0.53).   Medical History: Past Medical History  Diagnosis Date  . Hx of blood clots   . 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   . Family history of pulmonary embolism 02/08/2013    Father  . Chlamydia   . Supervision of high risk pregnancy in first trimester 10/16/2013    On lovenox 120 mg daily in 1 dose  . Abnormal Pap smear   . History of abnormal Pap smear 10/16/2013  . 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 She is currently being bridged with Lovenox. Her H/H is stable. Will start with 10mg  x 1 of Coumadin based on Coumadin score of 10. Will recheck PT/INR in am and give another dose prior to discharge.  Goal of Therapy:  INR 2-3 Monitor platelets by anticoagulation protocol: Yes   Plan:  1. Coumadin 10mg  PO x 1 now 2. PT/INR in am  Lenore MannerHolcombe, Jaleesa Cervi [place] 06/13/2014,11:04 AM

## 2014-06-13 NOTE — Telephone Encounter (Signed)
Pt has confirmed that [facility] and Dr. Darius BumpMalosaw notified per JVF.

## 2014-06-14 LAB — PROTIME-INR
INR: 0.89 (ref 0.00–1.49)
Prothrombin Time: 12.1 s (ref 11.6–15.2)

## 2014-06-14 MED ORDER — WARFARIN SODIUM 7.5 MG PO TABS
7.5000 mg | ORAL_TABLET | Freq: Once | ORAL | Status: AC
  Administered 2014-06-14: 7.5 mg via ORAL
  Filled 2014-06-14: qty 1

## 2014-06-14 MED ORDER — OXYCODONE-ACETAMINOPHEN 5-325 MG PO TABS: 1.0000 | ORAL_TABLET | ORAL | Status: DC | PRN

## 2014-06-14 MED ORDER — ENOXAPARIN SODIUM 120 MG/0.8ML ~~LOC~~ SOLN: 120.0000 mg | SUBCUTANEOUS | Status: DC

## 2014-06-14 MED ORDER — WARFARIN SODIUM 7.5 MG PO TABS: 7.5000 mg | ORAL_TABLET | Freq: Every day | ORAL | Status: DC

## 2014-06-14 NOTE — Progress Notes (Signed)
Discharge instructions reviewed with patient and patient is to follow up on 06/20/14 at 1000 for blood work for INR and address is [address] , [place] , phone # [phone].  Patient verbalized understanding and written address and phone number given to patient.  Mother Baby RN- Emilio MathLanette Curlie Sittner [facility]

## 2014-06-14 NOTE — Lactation Note (Signed)
This note was copied from the chart of [name]. Lactation Consultation Note  Patient Name: [name] Today's Date: 06/14/2014   Infant has breastfed x13 in past 24 hrs; voids -5 in 24 hrs/ 9 life; stools -0 in 24 hrs/ 5 life.  Mom is on Lovenox (L3).  Reports infant is breastfeeding well but reports slight nipple soreness.  Comfort gels given and explained use.  Hand pump given and explained and demonstrated use.  Educated on engorgement prevention.  Educated on growth spurts.  Reviewed milk collection and storage.  Informed of hospital support group and encouraged to call for questions after discharge if needed.       Lendon KaVann, Corey Caulfield Walker 06/14/2014, 11:48 AM

## 2014-06-14 NOTE — Discharge Summary (Signed)
Obstetric Discharge Summary Reason for Admission: rupture of membranes Prenatal Procedures: NST Intrapartum Procedures: spontaneous vaginal delivery Postpartum Procedures: Repair of first degree laceration Complications-Operative and Postpartum: none Hemoglobin  Date Value Ref Range Status  06/13/2014 11.0* 12.0 - 15.0 g/dL Final     HCT  Date Value Ref Range Status  06/13/2014 31.8* 36.0 - 46.0 % Final  Hospital Course: Pt. Admitted to L&D for PROM. Progressed to NSVD without complication. Delivery only complicated by 1st degree laceration repaired in the usual manner. Subsequent postpartum course remains uncomplicated. Patient is stable, tolerating po, limited pain, and tapered bleeding. Ready for discharge to home.   Pt started on Coumadin with a lovenox bridge. Will follow up in coumadin clinic for continued management.  Delivery Note  At 11:12 AM a viable female was delivered via Vaginal, Spontaneous Delivery (Presentation: Right Occiput Anterior). APGAR:8 ,9 ; weight pending .  Placenta status: intact , . Cord: 3 vessels with the following complications: None.  Anesthesia: Epidural  Episiotomy: None  Lacerations: 1st degree;Periurethral  Suture Repair: 2.0 vicryl  Est. Blood Loss (mL): 400  Mom to postpartum. Baby to Couplet care / Skin to Skin.  Called to delivery. Mother pushed over 5 min. Infant delivered to maternal abdomen. Delayed cord clamping performed. Active management of 3rd stage with traction and Pitocin and cytotec. Placenta delivered intact with 3v cord. Tear repaired with 3.0 vicryl on CT in usual manner. [id number]. Counts correct. Hemostatic.  Yolande Jollyaleb G Melancon, MD  Resident  Melancon, Hillery Hunteraleb G  06/12/2014, 11:46 AM  I was present for and supervised the delivery of this newborn by the resident. I agree with above documentation. Uterus remained boggy after delivery and required bimanual massage and cytotec 1000mcg with good results. 1st degree repaired as above.  Periurethral tears were hemostatic. Mom and baby to postpartum.  BECK, KELI L, MD   Physical Exam:  General: alert, cooperative and no distress Lochia: appropriate Uterine Fundus: firm Incision:  DVT Evaluation: No evidence of DVT seen on physical exam. No cords or calf tenderness.  Discharge Diagnoses: Term Pregnancy-delivered  Discharge Information: Date: 06/14/2014 Activity: unrestricted and pelvic rest Diet: routine Medications: PNV and Ibuprofen Condition: stable Instructions: refer to practice specific booklet Discharge to: home   Newborn Data: Live born female  Birth Weight: 7 lb 7.9 oz (3399 g) APGAR: 8, 9  Home with mother.  Melancon, Hillery HunterCaleb G 06/14/2014, 7:39 AM  I spoke with and examined patient and agree with resident's note and plan of care.  Tawana ScaleMichael Ryan Gurjit Loconte, MD OB Fellow 06/14/2014 9:17 AM

## 2014-06-14 NOTE — Discharge Instructions (Signed)
Vaginal Delivery °Care After °Refer to this sheet in the next few weeks. These discharge instructions provide you with information on caring for yourself after delivery. Your caregiver may also give you specific instructions. Your treatment has been planned according to the most current medical practices available, but problems sometimes occur. Call your caregiver if you have any problems or questions after you go home. °HOME CARE INSTRUCTIONS °· Take over-the-counter or prescription medicines only as directed by your caregiver or pharmacist. °· Do not drink alcohol, especially if you are breastfeeding or taking medicine to relieve pain. °· Do not chew or smoke tobacco. °· Do not use illegal drugs. °· Continue to use good perineal care. Good perineal care includes: °¨ Wiping your perineum from front to back. °¨ Keeping your perineum clean. °· Do not use tampons or douche until your caregiver says it is okay. °· Shower, wash your hair, and take tub baths as directed by your caregiver. °· Wear a well-fitting bra that provides breast support. °· Eat healthy foods. °· Drink enough fluids to keep your urine clear or pale yellow. °· Eat high-fiber foods such as whole grain cereals and breads, brown rice, beans, and fresh fruits and vegetables every day. These foods may help prevent or relieve constipation. °· Follow your cargiver's recommendations regarding resumption of activities such as climbing stairs, driving, lifting, exercising, or traveling. °· Talk to your caregiver about resuming sexual activities. Resumption of sexual activities is dependent upon your risk of infection, your rate of healing, and your comfort and desire to resume sexual activity. °· Try to have someone help you with your household activities and your newborn for at least a few days after you leave the hospital. °· Rest as much as possible. Try to rest or take a nap when your newborn is sleeping. °· Increase your activities gradually. °· Keep all  of your scheduled postpartum appointments. It is very important to keep your scheduled follow-up appointments. At these appointments, your caregiver will be checking to make sure that you are healing physically and emotionally. °SEEK MEDICAL CARE IF:  °· You are passing large clots from your vagina. Save any clots to show your caregiver. °· You have a foul smelling discharge from your vagina. °· You have trouble urinating. °· You are urinating frequently. °· You have pain when you urinate. °· You have a change in your bowel movements. °· You have increasing redness, pain, or swelling near your vaginal incision (episiotomy) or vaginal tear. °· You have pus draining from your episiotomy or vaginal tear. °· Your episiotomy or vaginal tear is separating. °· You have painful, hard, or reddened breasts. °· You have a severe headache. °· You have blurred vision or see spots. °· You feel sad or depressed. °· You have thoughts of hurting yourself or your newborn. °· You have questions about your care, the care of your newborn, or medicines. °· You are dizzy or lightheaded. °· You have a rash. °· You have nausea or vomiting. °· You were breastfeeding and have not had a menstrual period within 12 weeks after you stopped breastfeeding. °· You are not breastfeeding and have not had a menstrual period by the 12th week after delivery. °· You have a fever. °SEEK IMMEDIATE MEDICAL CARE IF:  °· You have persistent pain. °· You have chest pain. °· You have shortness of breath. °· You faint. °· You have leg pain. °· You have stomach pain. °· Your vaginal bleeding saturates two or more sanitary pads   in 1 hour. MAKE SURE YOU:   Understand these instructions.  Will watch your condition.  Will get help right away if you are not doing well or get worse. Document Released: 11/20/2000 Document Revised: 08/17/2012 Document Reviewed: 07/20/2012 [facility] Patient Information 2015 [employer] [place]. This information is not intended to  replace advice given to you by your health care provider. Make sure you discuss any questions you have with your health care provider.   Your Appointment For Coumadin Clinic is at [address], [place] [id number] Your Appointment time is 10:00 AM 06/20/2014

## 2014-07-02 ENCOUNTER — Ambulatory Visit (HOSPITAL_COMMUNITY): Payer: Medicaid Other

## 2014-07-18 ENCOUNTER — Ambulatory Visit: Payer: Medicaid Other | Admitting: Women's Health

## 2014-07-18 ENCOUNTER — Encounter: Payer: Self-pay | Admitting: *Deleted

## 2014-08-20 ENCOUNTER — Encounter: Payer: Self-pay | Admitting: Adult Health

## 2014-08-20 ENCOUNTER — Ambulatory Visit (INDEPENDENT_AMBULATORY_CARE_PROVIDER_SITE_OTHER): Payer: Medicaid Other | Admitting: Adult Health

## 2014-08-20 DIAGNOSIS — Z309 Encounter for contraceptive management, unspecified: Secondary | ICD-10-CM | POA: Insufficient documentation

## 2014-08-20 DIAGNOSIS — Z8742 Personal history of other diseases of the female genital tract: Secondary | ICD-10-CM

## 2014-08-20 DIAGNOSIS — Z30011 Encounter for initial prescription of contraceptive pills: Secondary | ICD-10-CM

## 2014-08-20 DIAGNOSIS — Z86711 Personal history of pulmonary embolism: Secondary | ICD-10-CM

## 2014-08-20 HISTORY — DX: Personal history of pulmonary embolism: Z86.711

## 2014-08-20 HISTORY — DX: Encounter for contraceptive management, unspecified: Z30.9

## 2014-08-20 MED ORDER — NORETHINDRONE 0.35 MG PO TABS: 1.0000 | ORAL_TABLET | Freq: Every day | ORAL | Status: DC

## 2014-08-20 NOTE — Progress Notes (Signed)
Patient ID: [name], female   DOB: 15-Jul-1991, 23 y.o.   MRN: [id number] [name] is a 23 year old black female in for postpartum care.  Delivery Date:06/12/14   Method of Delivery: Vaginal delivery of baby girl,[name],7 lbs 7 ozs  Sexual Activity since delivery: NO  Method of Feeding: Breastfeeding  Number of weeks bleeding post delivery: 3-4 weeks  Reviewed past medical,surgical, social and family history. Reviewed medications and allergies.  Review of Systems: Patient denies any headaches, blurred vision, shortness of breath, chest pain, abdominal pain, problems with bowel movements, urination, or intercourse. No sex yet, no joint pain or mood swings.  Depression Score: 0  BP 118/70  Ht  (1.676 m)  Wt 178 lb 8 oz (80.967 kg)  BMI 28.82 kg/m2  LMP 08/16/2014  Breastfeeding? Yes Pelvic Exam:   External genitalia is normal in appearance.  The vagina is normal in appearance. The cervix is bulbous.  Uterus is felt to be normal size, shape, and contour, well involuted.  No adnexal masses or tenderness noted.Pt wants pill for birth control not interested in IUD or nexplanon, she has history of PE in 2012.She also had abnormal pap in 2014. Discussed using POP with Dr [name] and [name]., PA in hematology/oncology at [facility], and both agree,it is OK to use.  Impression:  Status post delivery, post partum check, depression screening, contraceptive management.History of PE and History of abnormal pap.   Plan:   Rx micronor disp 1 pack take 1 daily refill x 11, start today and use condoms for at least 1 month Return in 4 weeks for pap and physical  Try to put breast milk in bottle, before going back to work next week. Review handout on OC use

## 2014-08-20 NOTE — Patient Instructions (Signed)
Can start micronor now Use condoms Return in 4 weeks for pap and physical Oral Contraception Use Oral contraceptive pills (OCPs) are medicines taken to prevent pregnancy. OCPs work by preventing the ovaries from releasing eggs. The hormones in OCPs also cause the cervical mucus to thicken, preventing the sperm from entering the uterus. The hormones also cause the uterine lining to become thin, not allowing a fertilized egg to attach to the inside of the uterus. OCPs are highly effective when taken exactly as prescribed. However, OCPs do not prevent sexually transmitted diseases (STDs). Safe sex practices, such as using condoms along with an OCP, can help prevent STDs. Before taking OCPs, you may have a physical exam and Pap test. Your health care provider may also order blood tests if necessary. Your health care provider will make sure you are a good candidate for oral contraception. Discuss with your health care provider the possible side effects of the OCP you may be prescribed. When starting an OCP, it can take 2 to 3 months for the body to adjust to the changes in hormone levels in your body.  HOW TO TAKE ORAL CONTRACEPTIVE PILLS Your health care provider may advise you on how to start taking the first cycle of OCPs. Otherwise, you can:   Start on day 1 of your menstrual period. You will not need any backup contraceptive protection with this start time.   Start on the first Sunday after your menstrual period or the day you get your prescription. In these cases, you will need to use backup contraceptive protection for the first week.   Start the pill at any time of your cycle. If you take the pill within 5 days of the start of your period, you are protected against pregnancy right away. In this case, you will not need a backup form of birth control. If you start at any other time of your menstrual cycle, you will need to use another form of birth control for 7 days. If your OCP is the type called a  minipill, it will protect you from pregnancy after taking it for 2 days (48 hours). After you have started taking OCPs:   If you forget to take 1 pill, take it as soon as you remember. Take the next pill at the regular time.   If you miss 2 or more pills, call your health care provider because different pills have different instructions for missed doses. Use backup birth control until your next menstrual period starts.   If you use a 28-day pack that contains inactive pills and you miss 1 of the last 7 pills (pills with no hormones), it will not matter. Throw away the rest of the non-hormone pills and start a new pill pack.  No matter which day you start the OCP, you will always start a new pack on that same day of the week. Have an extra pack of OCPs and a backup contraceptive method available in case you miss some pills or lose your OCP pack.  HOME CARE INSTRUCTIONS   Do not smoke.   Always use a condom to protect against STDs. OCPs do not protect against STDs.   Use a calendar to mark your menstrual period days.   Read the information and directions that came with your OCP. Talk to your health care provider if you have questions.  SEEK MEDICAL CARE IF:   You develop nausea and vomiting.   You have abnormal vaginal discharge or bleeding.   You develop  a rash.   You miss your menstrual period.   You are losing your hair.   You need treatment for mood swings or depression.   You get dizzy when taking the OCP.   You develop acne from taking the OCP.   You become pregnant.  SEEK IMMEDIATE MEDICAL CARE IF:   You develop chest pain.   You develop shortness of breath.   You have an uncontrolled or severe headache.   You develop numbness or slurred speech.   You develop visual problems.   You develop pain, redness, and swelling in the legs.  Document Released: 11/12/2011 Document Revised: 04/09/2014 Document Reviewed: 05/14/2013 [facility] Patient  Information 2015 [place], [place]. This information is not intended to replace advice given to you by your health care provider. Make sure you discuss any questions you have with your health care provider.

## 2014-09-17 ENCOUNTER — Encounter: Payer: Self-pay | Admitting: Adult Health

## 2014-09-17 ENCOUNTER — Other Ambulatory Visit: Payer: Medicaid Other | Admitting: Adult Health

## 2014-10-08 ENCOUNTER — Encounter: Payer: Self-pay | Admitting: Adult Health

## 2015-09-23 ENCOUNTER — Other Ambulatory Visit: Payer: Medicaid Other | Admitting: Adult Health

## 2015-11-25 ENCOUNTER — Ambulatory Visit: Payer: Self-pay | Admitting: Women's Health

## 2015-12-16 ENCOUNTER — Other Ambulatory Visit: Payer: Self-pay | Admitting: Obstetrics & Gynecology

## 2015-12-16 DIAGNOSIS — O3680X0 Pregnancy with inconclusive fetal viability, not applicable or unspecified: Secondary | ICD-10-CM

## 2015-12-19 ENCOUNTER — Other Ambulatory Visit: Payer: Self-pay

## 2016-02-08 HISTORY — PX: INDUCED ABORTION: SHX677

## 2016-02-26 ENCOUNTER — Encounter: Payer: Self-pay | Admitting: Adult Health

## 2016-02-26 ENCOUNTER — Ambulatory Visit (INDEPENDENT_AMBULATORY_CARE_PROVIDER_SITE_OTHER): Payer: Medicaid Other | Admitting: Adult Health

## 2016-02-26 ENCOUNTER — Other Ambulatory Visit (HOSPITAL_COMMUNITY)
Admission: RE | Admit: 2016-02-26 | Discharge: 2016-02-26 | Disposition: A | Discharge status: Home / Self Care | Payer: Medicaid Other | Source: Ambulatory Visit | Attending: Adult Health | Admitting: Adult Health

## 2016-02-26 VITALS — BP 110/78 | HR 64 | Ht 65.5 in | Wt 185.0 lb

## 2016-02-26 DIAGNOSIS — Z8742 Personal history of other diseases of the female genital tract: Secondary | ICD-10-CM

## 2016-02-26 DIAGNOSIS — Z3009 Encounter for other general counseling and advice on contraception: Secondary | ICD-10-CM

## 2016-02-26 DIAGNOSIS — Z30011 Encounter for initial prescription of contraceptive pills: Secondary | ICD-10-CM

## 2016-02-26 DIAGNOSIS — Z113 Encounter for screening for infections with a predominantly sexual mode of transmission: Secondary | ICD-10-CM | POA: Insufficient documentation

## 2016-02-26 DIAGNOSIS — Z01411 Encounter for gynecological examination (general) (routine) with abnormal findings: Secondary | ICD-10-CM | POA: Insufficient documentation

## 2016-02-26 DIAGNOSIS — Z01419 Encounter for gynecological examination (general) (routine) without abnormal findings: Secondary | ICD-10-CM

## 2016-02-26 DIAGNOSIS — Z86711 Personal history of pulmonary embolism: Secondary | ICD-10-CM

## 2016-02-26 LAB — POCT URINE PREGNANCY: PREG TEST UR: NEGATIVE

## 2016-02-26 MED ORDER — NORETHINDRONE 0.35 MG PO TABS: 1.0000 | ORAL_TABLET | Freq: Every day | ORAL | Status: DC

## 2016-02-26 NOTE — Patient Instructions (Signed)
Physical in 1 year Use condoms  Start micronor today

## 2016-02-26 NOTE — Progress Notes (Signed)
Patient ID: [name], female   DOB: 1991/07/07, 25 y.o.   MRN: [id number] History of Present Illness:  [name] is a 25 year old black female in for a well woman gyn exam and pap, and wants to get on birth control.She has family planning medicaid, and had elective abortion 02/08/16 at 16+5 weeks.Last pap 04/10/13 LGSIL.Has history of PE.  Current Medications, Allergies, Past Medical History, Past Surgical History, Family History and Social History were reviewed in [facility] electronic medical record.     Review of Systems: Patient denies any headaches, hearing loss, fatigue, blurred vision, shortness of breath, chest pain, abdominal pain, problems with bowel movements, urination, or intercourse. No joint pain or mood swings.    Physical Exam:BP 110/78 mmHg  Pulse 64  Ht 5' 5.5" (1.664 m)  Wt 185 lb (83.915 kg)  BMI 30.31 kg/m2  LMP 10/22/2015  Breastfeeding? No UPT negative General:  Well developed, well nourished, no acute distress Skin:  Warm and dry Neck:  Midline trachea, normal thyroid, good ROM, no lymphadenopathy Lungs; Clear to auscultation bilaterally Breast:  No dominant palpable mass, retraction, or nipple discharge Cardiovascular: Regular rate and rhythm Abdomen:  Soft, non tender, no hepatosplenomegaly Pelvic:  External genitalia is normal in appearance, no lesions.  The vagina is normal in appearance. Urethra has no lesions or masses. The cervix is bulbous.Pap with GC/CHL performed.  Uterus is felt to be normal size, shape, and contour.  No adnexal masses or tenderness noted.Bladder is non tender, no masses felt. Extremities/musculoskeletal:  No swelling or varicosities noted, no clubbing or cyanosis Psych:  No mood changes, alert and cooperative,seems happy   Impression:  Well woman gyn exam and pap Contraceptive management History of PE History of abnormal pap   Plan: Check HIV and RPR Rx micronor disp 1 pack take 1 daily with 11 refills, can start  today Use condoms, 4 given Physical in 1 year

## 2016-02-27 LAB — HIV ANTIBODY (ROUTINE TESTING W REFLEX): HIV Screen 4th Generation wRfx: NONREACTIVE

## 2016-02-27 LAB — RPR: RPR: NONREACTIVE

## 2016-03-02 LAB — CYTOLOGY - PAP

## 2016-03-03 ENCOUNTER — Telehealth: Payer: Self-pay | Admitting: *Deleted

## 2016-03-03 ENCOUNTER — Other Ambulatory Visit: Payer: Self-pay | Admitting: Women's Health

## 2016-03-03 ENCOUNTER — Encounter: Payer: Self-pay | Admitting: Women's Health

## 2016-03-03 DIAGNOSIS — A599 Trichomoniasis, unspecified: Secondary | ICD-10-CM | POA: Insufficient documentation

## 2016-03-03 MED ORDER — METRONIDAZOLE 500 MG PO TABS: 2000.0000 mg | ORAL_TABLET | Freq: Once | ORAL | Status: DC

## 2016-03-03 NOTE — Telephone Encounter (Signed)
Pt informed Pap from 02/26/2016 normal but showed Trich, Flaygl sent to [facility] [place], no ETOH while taking med, no sex until POC - 3 weeks, appt scheduled 04/18. Called Rx for pt partner, Damita LackCorey Keen, DOB 08/10/1991, no known allergies, to [facility] [place] per Joellyn HaffKim Booker, CNM.

## 2016-03-24 ENCOUNTER — Ambulatory Visit: Payer: Self-pay | Admitting: Adult Health

## 2016-03-24 ENCOUNTER — Ambulatory Visit: Payer: Self-pay | Admitting: Women's Health

## 2016-04-15 ENCOUNTER — Encounter: Payer: Self-pay | Admitting: Adult Health

## 2016-04-15 ENCOUNTER — Ambulatory Visit (INDEPENDENT_AMBULATORY_CARE_PROVIDER_SITE_OTHER): Payer: Self-pay | Admitting: Adult Health

## 2016-04-15 VITALS — BP 118/60 | HR 74 | Ht 66.0 in | Wt 185.0 lb

## 2016-04-15 DIAGNOSIS — Z8619 Personal history of other infectious and parasitic diseases: Secondary | ICD-10-CM

## 2016-04-15 HISTORY — DX: Personal history of other infectious and parasitic diseases: Z86.19

## 2016-04-15 LAB — POCT WET PREP (WET MOUNT)
TRICHOMONAS WET PREP HPF POC: NEGATIVE
WBC, Wet Prep HPF POC: NEGATIVE

## 2016-04-15 NOTE — Progress Notes (Signed)
Subjective:     Patient ID: [name], female   DOB: January 06, 1991, 25 y.o.   MRN: [id number]  HPI [name] is a 25 year old black female in for proof of treatment for recent trich infection, she and partner took meds and have had sex with a condom.No complaints.Has stopped micronor, missed up on pills.  Review of Systems Patient denies any headaches, hearing loss, fatigue, blurred vision, shortness of breath, chest pain, abdominal pain, problems with bowel movements, urination, or intercourse. No joint pain or mood swings. Reviewed past medical,surgical, social and family history. Reviewed medications and allergies.     Objective:   Physical Exam BP 118/60 mmHg  Pulse 74  Ht 5\' 6"  (1.676 m)  Wt 185 lb (83.915 kg)  BMI 29.87 kg/m2  LMP 04/12/2016  Breastfeeding? No Skin warm and dry.Pelvic: external genitalia is normal in appearance no lesions, vagina: period like blood without odor,urethra has no lesions or masses noted, cervix:smooth, uterus: normal size, shape and contour, non tender, no masses felt, adnexa: no masses or tenderness noted. Bladder is non tender and no masses felt. Wet prep: + RBCs    Assessment:     History of trich, for proof of treatment    Plan:     Use condoms, restart micronor today Follow up prn

## 2016-04-15 NOTE — Patient Instructions (Signed)
Resume micronor today use condoms Follow up prn

## 2016-07-13 ENCOUNTER — Other Ambulatory Visit: Payer: Medicaid Other

## 2016-07-17 ENCOUNTER — Other Ambulatory Visit: Payer: Self-pay | Admitting: Obstetrics and Gynecology

## 2016-07-17 DIAGNOSIS — O3680X0 Pregnancy with inconclusive fetal viability, not applicable or unspecified: Secondary | ICD-10-CM

## 2016-07-20 ENCOUNTER — Other Ambulatory Visit: Payer: Self-pay | Admitting: Obstetrics and Gynecology

## 2016-07-20 ENCOUNTER — Other Ambulatory Visit: Payer: Medicaid Other

## 2016-07-20 ENCOUNTER — Ambulatory Visit (INDEPENDENT_AMBULATORY_CARE_PROVIDER_SITE_OTHER): Payer: Medicaid Other

## 2016-07-20 DIAGNOSIS — Z36 Encounter for antenatal screening of mother: Secondary | ICD-10-CM

## 2016-07-20 DIAGNOSIS — Z3A14 14 weeks gestation of pregnancy: Secondary | ICD-10-CM | POA: Diagnosis not present

## 2016-07-20 DIAGNOSIS — Z369 Encounter for antenatal screening, unspecified: Secondary | ICD-10-CM

## 2016-07-20 DIAGNOSIS — O3680X0 Pregnancy with inconclusive fetal viability, not applicable or unspecified: Secondary | ICD-10-CM

## 2016-07-20 NOTE — Progress Notes (Signed)
US 13+3 wks,crl 73.0 mm,NT 1.7 mm,pos fht 161 bpm,normal ov's bilat,NB present

## 2016-07-22 LAB — MATERNAL SCREEN, INTEGRATED #1
Crown Rump Length: 73 mm
GEST. AGE ON COLLECTION DATE: 13.1 wk
MATERNAL AGE AT EDD: 25.3 a
NUCHAL TRANSLUCENCY (NT): 1.7 mm
Number of Fetuses: 1
PAPP-A VALUE: 844.5 ng/mL
PDF: 0
Weight: 198 [lb_av]

## 2016-07-29 ENCOUNTER — Encounter: Payer: Self-pay | Admitting: Advanced Practice Midwife

## 2016-07-29 ENCOUNTER — Ambulatory Visit (INDEPENDENT_AMBULATORY_CARE_PROVIDER_SITE_OTHER): Payer: Medicaid Other | Admitting: Advanced Practice Midwife

## 2016-07-29 VITALS — BP 116/72 | HR 76 | Wt 200.0 lb

## 2016-07-29 DIAGNOSIS — Z1389 Encounter for screening for other disorder: Secondary | ICD-10-CM | POA: Diagnosis not present

## 2016-07-29 DIAGNOSIS — O88812 Other embolism in pregnancy, second trimester: Secondary | ICD-10-CM

## 2016-07-29 DIAGNOSIS — Z0283 Encounter for blood-alcohol and blood-drug test: Secondary | ICD-10-CM

## 2016-07-29 DIAGNOSIS — Z331 Pregnant state, incidental: Secondary | ICD-10-CM

## 2016-07-29 DIAGNOSIS — O0942 Supervision of pregnancy with grand multiparity, second trimester: Secondary | ICD-10-CM

## 2016-07-29 DIAGNOSIS — I2699 Other pulmonary embolism without acute cor pulmonale: Secondary | ICD-10-CM | POA: Diagnosis not present

## 2016-07-29 DIAGNOSIS — O09892 Supervision of other high risk pregnancies, second trimester: Secondary | ICD-10-CM

## 2016-07-29 DIAGNOSIS — Z3A15 15 weeks gestation of pregnancy: Secondary | ICD-10-CM

## 2016-07-29 DIAGNOSIS — O09899 Supervision of other high risk pregnancies, unspecified trimester: Secondary | ICD-10-CM | POA: Insufficient documentation

## 2016-07-29 DIAGNOSIS — Z369 Encounter for antenatal screening, unspecified: Secondary | ICD-10-CM

## 2016-07-29 MED ORDER — ENOXAPARIN SODIUM 80 MG/0.8ML ~~LOC~~ SOLN: 80.0000 mg | SUBCUTANEOUS | 8 refills | Status: DC

## 2016-07-29 NOTE — Progress Notes (Signed)
Subjective:    [name] is a [id number] being seen today for her first obstetrical visit.  Her obstetrical history is significant for 2 term SVD and 3 TABs.  .  Pregnancy history fully reviewed. She had an unprovoked PE 2012 (WU neg).  [facility] reports no complaints.  Vitals:   07/29/16 1437  BP: 116/72  Pulse: 76  Weight: 200 lb (90.7 kg)    HISTORY: OB History  Gravida Para Term Preterm AB Living  6 2 2   3 2   SAB TAB Ectopic Multiple Live Births    3     2    # Outcome Date GA Lbr Len/2nd Weight Sex Delivery Anes PTL Lv  6 Current           5 TAB 2016          4 Term 06/12/14 5526w6d -10:38 / 00:50 7 lb 7.9 oz (3.399 kg) F Vag-Spont EPI N LIV     Birth Comments: caput  3 TAB 2013          2 TAB 2012          1 Term 11/13/09 3217w0d  6 lb 5 oz (2.863 kg) M Vag-Spont EPI N LIV     Past Medical History:  Diagnosis Date  . Abnormal Pap smear   . Chlamydia   . Contraceptive management 08/20/2014  . Family history of pulmonary embolism 02/08/2013   Father  . History of abnormal Pap smear 10/16/2013  . History of pulmonary embolus (PE) 08/20/2014  . History of trichomoniasis 04/15/2016  . 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 Hx of blood clots   . PE (pulmonary embolism) 02/08/2013   pulmonary embolus in May 2012 with infarction of the right lower lobe as well as pain in her lower legs presumably the site of origin.   . Supervision of high risk pregnancy in first trimester 10/16/2013   On lovenox 120 mg daily in 1 dose  . Vaginal Pap smear, abnormal    Past Surgical History:  Procedure Laterality Date  . INDUCED ABORTION  2012,2013  . INDUCED ABORTION  02/08/16   Family History  Problem Relation Age of Onset  . Clotting disorder Father   . Diabetes Maternal Grandmother   . Hypertension Maternal Grandmother   . CAD Maternal Grandmother      Exam                                      System:     Skin: normal  coloration and turgor, no rashes    Neurologic: oriented, normal, normal mood   Extremities: normal strength, tone, and muscle mass   HEENT PERRLA   Mouth/Teeth mucous membranes moist, normAL dentition   Neck supple and no masses   Cardiovascular: regular rate and rhythm   Respiratory:  appears well, vitals normal, no respiratory distress, acyanotic   Abdomen: soft, non-tender;  FHR: 150          Assessment:    Pregnancy: [id number] Patient Active Problem List   Diagnosis Date Noted  . Supervision of other high-risk pregnancy 07/29/2016  . History of trichomoniasis 04/15/2016  . Trichomonas infection 03/03/2016  . Contraceptive management 08/20/2014  . History of pulmonary embolus (PE) 08/20/2014  . PROM (premature rupture of membranes) 06/12/2014  . History of abnormal Pap smear 10/16/2013  .  Dyspareunia 03/23/2013  . Chlamydia 03/22/2013  . 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 PE (pulmonary embolism) 02/08/2013  . Family history of pulmonary embolism 02/08/2013        Plan:     Initial labs drawn. Continue prenatal vitamins  Problem list reviewed and updated  Reviewed recommended weight gain based on pre-gravid BMI  Encouraged well-balanced diet Genetic Screening discussed Integrated Screen: requested.  Ultrasound discussed; fetal survey: requested.  Return in about 3 weeks (around 08/19/2016) for HROB, 2nd IT.  CRESENZO-DISHMAN,Parul Porcelli 07/29/2016

## 2016-07-29 NOTE — Patient Instructions (Signed)
 First Trimester of Pregnancy The first trimester of pregnancy is from week 1 until the end of week 12 (months 1 through 3). A week after a sperm fertilizes an egg, the egg will implant on the wall of the uterus. This embryo will begin to develop into a baby. Genes from you and your partner are forming the baby. The female genes determine whether the baby is a boy or a girl. At 6-8 weeks, the eyes and face are formed, and the heartbeat can be seen on ultrasound. At the end of 12 weeks, all the baby's organs are formed.  Now that you are pregnant, you will want to do everything you can to have a healthy baby. Two of the most important things are to get good prenatal care and to follow your health care provider's instructions. Prenatal care is all the medical care you receive before the baby's birth. This care will help prevent, find, and treat any problems during the pregnancy and childbirth. BODY CHANGES Your body goes through many changes during pregnancy. The changes vary from woman to woman.   You may gain or lose a couple of pounds at first.  You may feel sick to your stomach (nauseous) and throw up (vomit). If the vomiting is uncontrollable, call your health care provider.  You may tire easily.  You may develop headaches that can be relieved by medicines approved by your health care provider.  You may urinate more often. Painful urination may mean you have a bladder infection.  You may develop heartburn as a result of your pregnancy.  You may develop constipation because certain hormones are causing the muscles that push waste through your intestines to slow down.  You may develop hemorrhoids or swollen, bulging veins (varicose veins).  Your breasts may begin to grow larger and become tender. Your nipples may stick out more, and the tissue that surrounds them (areola) may become darker.  Your gums may bleed and may be sensitive to brushing and flossing.  Dark spots or blotches  (chloasma, mask of pregnancy) may develop on your face. This will likely fade after the baby is born.  Your menstrual periods will stop.  You may have a loss of appetite.  You may develop cravings for certain kinds of food.  You may have changes in your emotions from day to day, such as being excited to be pregnant or being concerned that something may go wrong with the pregnancy and baby.  You may have more vivid and strange dreams.  You may have changes in your hair. These can include thickening of your hair, rapid growth, and changes in texture. Some women also have hair loss during or after pregnancy, or hair that feels dry or thin. Your hair will most likely return to normal after your baby is born. WHAT TO EXPECT AT YOUR PRENATAL VISITS During a routine prenatal visit:  You will be weighed to make sure you and the baby are growing normally.  Your blood pressure will be taken.  Your abdomen will be measured to track your baby's growth.  The fetal heartbeat will be listened to starting around week 10 or 12 of your pregnancy.  Test results from any previous visits will be discussed. Your health care provider may ask you:  How you are feeling.  If you are feeling the baby move.  If you have had any abnormal symptoms, such as leaking fluid, bleeding, severe headaches, or abdominal cramping.  If you have any questions. Other   tests that may be performed during your first trimester include:  Blood tests to find your blood type and to check for the presence of any previous infections. They will also be used to check for low iron levels (anemia) and Rh antibodies. Later in the pregnancy, blood tests for diabetes will be done along with other tests if problems develop.  Urine tests to check for infections, diabetes, or protein in the urine.  An ultrasound to confirm the proper growth and development of the baby.  An amniocentesis to check for possible genetic problems.  Fetal  screens for spina bifida and Down syndrome.  You may need other tests to make sure you and the baby are doing well. HOME CARE INSTRUCTIONS  Medicines  Follow your health care provider's instructions regarding medicine use. Specific medicines may be either safe or unsafe to take during pregnancy.  Take your prenatal vitamins as directed.  If you develop constipation, try taking a stool softener if your health care provider approves. Diet  Eat regular, well-balanced meals. Choose a variety of foods, such as meat or vegetable-based protein, fish, milk and low-fat dairy products, vegetables, fruits, and whole grain breads and cereals. Your health care provider will help you determine the amount of weight gain that is right for you.  Avoid raw meat and uncooked cheese. These carry germs that can cause birth defects in the baby.  Eating four or five small meals rather than three large meals a day may help relieve nausea and vomiting. If you start to feel nauseous, eating a few soda crackers can be helpful. Drinking liquids between meals instead of during meals also seems to help nausea and vomiting.  If you develop constipation, eat more high-fiber foods, such as fresh vegetables or fruit and whole grains. Drink enough fluids to keep your urine clear or pale yellow. Activity and Exercise  Exercise only as directed by your health care provider. Exercising will help you:  Control your weight.  Stay in shape.  Be prepared for labor and delivery.  Experiencing pain or cramping in the lower abdomen or low back is a good sign that you should stop exercising. Check with your health care provider before continuing normal exercises.  Try to avoid standing for long periods of time. Move your legs often if you must stand in one place for a long time.  Avoid heavy lifting.  Wear low-heeled shoes, and practice good posture.  You may continue to have sex unless your health care provider directs you  otherwise. Relief of Pain or Discomfort  Wear a good support bra for breast tenderness.   Take warm sitz baths to soothe any pain or discomfort caused by hemorrhoids. Use hemorrhoid cream if your health care provider approves.   Rest with your legs elevated if you have leg cramps or low back pain.  If you develop varicose veins in your legs, wear support hose. Elevate your feet for 15 minutes, 3-4 times a day. Limit salt in your diet. Prenatal Care  Schedule your prenatal visits by the twelfth week of pregnancy. They are usually scheduled monthly at first, then more often in the last 2 months before delivery.  Write down your questions. Take them to your prenatal visits.  Keep all your prenatal visits as directed by your health care provider. Safety  Wear your seat belt at all times when driving.  Make a list of emergency phone numbers, including numbers for family, friends, the hospital, and police and fire departments. General   Tips  Ask your health care provider for a referral to a local prenatal education class. Begin classes no later than at the beginning of month 6 of your pregnancy.  Ask for help if you have counseling or nutritional needs during pregnancy. Your health care provider can offer advice or refer you to specialists for help with various needs.  Do not use hot tubs, steam rooms, or saunas.  Do not douche or use tampons or scented sanitary pads.  Do not cross your legs for long periods of time.  Avoid cat litter boxes and soil used by cats. These carry germs that can cause birth defects in the baby and possibly loss of the fetus by miscarriage or stillbirth.  Avoid all smoking, herbs, alcohol, and medicines not prescribed by your health care provider. Chemicals in these affect the formation and growth of the baby.  Schedule a dentist appointment. At home, brush your teeth with a soft toothbrush and be gentle when you floss. SEEK MEDICAL CARE IF:   You have  dizziness.  You have mild pelvic cramps, pelvic pressure, or nagging pain in the abdominal area.  You have persistent nausea, vomiting, or diarrhea.  You have a bad smelling vaginal discharge.  You have pain with urination.  You notice increased swelling in your face, hands, legs, or ankles. SEEK IMMEDIATE MEDICAL CARE IF:   You have a fever.  You are leaking fluid from your vagina.  You have spotting or bleeding from your vagina.  You have severe abdominal cramping or pain.  You have rapid weight gain or loss.  You vomit blood or material that looks like coffee grounds.  You are exposed to German measles and have never had them.  You are exposed to fifth disease or chickenpox.  You develop a severe headache.  You have shortness of breath.  You have any kind of trauma, such as from a fall or a car accident. Document Released: 11/17/2001 Document Revised: 04/09/2014 Document Reviewed: 10/03/2013 [employer] Patient Information 2015 [employer]. This information is not intended to replace advice given to you by your health care provider. Make sure you discuss any questions you have with your health care provider.   Nausea & Vomiting  Have saltine crackers or pretzels by your bed and eat a few bites before you raise your head out of bed in the morning  Eat small frequent meals throughout the day instead of large meals  Drink plenty of fluids throughout the day to stay hydrated, just don't drink a lot of fluids with your meals.  This can make your stomach fill up faster making you feel sick  Do not brush your teeth right after you eat  Products with real ginger are good for nausea, like ginger ale and ginger hard candy Make sure it says made with real ginger!  Sucking on sour candy like lemon heads is also good for nausea  If your prenatal vitamins make you nauseated, take them at night so you will sleep through the nausea  Sea Bands  If you feel like you need  medicine for the nausea & vomiting please let us know  If you are unable to keep any fluids or food down please let us know   Constipation  Drink plenty of fluid, preferably water, throughout the day  Eat foods high in fiber such as fruits, vegetables, and grains  Exercise, such as walking, is a good way to keep your bowels regular  Drink warm fluids, especially warm   prune juice, or decaf coffee  Eat a 1/2 cup of real oatmeal (not instant), 1/2 cup applesauce, and 1/2-1 cup warm prune juice every day  If needed, you may take Colace (docusate sodium) stool softener once or twice a day to help keep the stool soft. If you are pregnant, wait until you are out of your first trimester (12-14 weeks of pregnancy)  If you still are having problems with constipation, you may take Miralax once daily as needed to help keep your bowels regular.  If you are pregnant, wait until you are out of your first trimester (12-14 weeks of pregnancy)  Safe Medications in Pregnancy   Acne: Benzoyl Peroxide Salicylic Acid  Backache/Headache: Tylenol: 2 regular strength every 4 hours OR              2 Extra strength every 6 hours  Colds/Coughs/Allergies: Benadryl (alcohol free) 25 mg every 6 hours as needed Breath right strips Claritin Cepacol throat lozenges Chloraseptic throat spray Cold-Eeze- up to three times per day Cough drops, alcohol free Flonase (by prescription only) Guaifenesin Mucinex Robitussin DM (plain only, alcohol free) Saline nasal spray/drops Sudafed (pseudoephedrine) & Actifed ** use only after [redacted] weeks gestation and if you do not have high blood pressure Tylenol Vicks Vaporub Zinc lozenges Zyrtec   Constipation: Colace Ducolax suppositories Fleet enema Glycerin suppositories Metamucil Milk of magnesia Miralax Senokot Smooth move tea  Diarrhea: Kaopectate Imodium A-D  *NO pepto Bismol  Hemorrhoids: Anusol Anusol HC Preparation  H Tucks  Indigestion: Tums Maalox Mylanta Zantac  Pepcid  Insomnia: Benadryl (alcohol free) 25mg every 6 hours as needed Tylenol PM Unisom, no Gelcaps  Leg Cramps: Tums MagGel  Nausea/Vomiting:  Bonine Dramamine Emetrol Ginger extract Sea bands Meclizine  Nausea medication to take during pregnancy:  Unisom (doxylamine succinate 25 mg tablets) Take one tablet daily at bedtime. If symptoms are not adequately controlled, the dose can be increased to a maximum recommended dose of two tablets daily (1/2 tablet in the morning, 1/2 tablet mid-afternoon and one at bedtime). Vitamin B6 100mg tablets. Take one tablet twice a day (up to 200 mg per day).  Skin Rashes: Aveeno products Benadryl cream or 25mg every 6 hours as needed Calamine Lotion 1% cortisone cream  Yeast infection: Gyne-lotrimin 7 Monistat 7   **If taking multiple medications, please check labels to avoid duplicating the same active ingredients **take medication as directed on the label ** Do not exceed 4000 mg of tylenol in 24 hours **Do not take medications that contain aspirin or ibuprofen      

## 2016-07-30 LAB — PMP SCREEN PROFILE (10S), URINE
Amphetamine Screen, Ur: NEGATIVE ng/mL
BARBITURATE SCRN UR: NEGATIVE ng/mL
BENZODIAZEPINE SCREEN, URINE: NEGATIVE ng/mL
CANNABINOIDS UR QL SCN: NEGATIVE ng/mL
Cocaine(Metab.)Screen, Urine: NEGATIVE ng/mL
Creatinine(Crt), U: 98.1 mg/dL (ref 20.0–300.0)
Methadone Scn, Ur: NEGATIVE ng/mL
OPIATE SCRN UR: NEGATIVE ng/mL
Oxycodone+Oxymorphone Ur Ql Scn: NEGATIVE ng/mL
PCP Scrn, Ur: NEGATIVE ng/mL
Ph of Urine: 7.6 (ref 4.5–8.9)
Propoxyphene, Screen: NEGATIVE ng/mL

## 2016-07-30 LAB — URINALYSIS, ROUTINE W REFLEX MICROSCOPIC
Bilirubin, UA: NEGATIVE
Glucose, UA: NEGATIVE
Ketones, UA: NEGATIVE
NITRITE UA: NEGATIVE
PH UA: 7.5 (ref 5.0–7.5)
Protein, UA: NEGATIVE
RBC, UA: NEGATIVE
SPEC GRAV UA: 1.011 (ref 1.005–1.030)
Urobilinogen, Ur: 0.2 mg/dL (ref 0.2–1.0)

## 2016-07-30 LAB — SICKLE CELL SCREEN: SICKLE CELL SCREEN: POSITIVE — AB

## 2016-07-30 LAB — CBC
HEMOGLOBIN: 11.5 g/dL (ref 11.1–15.9)
Hematocrit: 34.5 % (ref 34.0–46.6)
MCH: 28.5 pg (ref 26.6–33.0)
MCHC: 33.3 g/dL (ref 31.5–35.7)
MCV: 85 fL (ref 79–97)
PLATELETS: 265 10*3/uL (ref 150–379)
RBC: 4.04 x10E6/uL (ref 3.77–5.28)
RDW: 14.7 % (ref 12.3–15.4)
WBC: 8.8 10*3/uL (ref 3.4–10.8)

## 2016-07-30 LAB — ANTIBODY SCREEN: ANTIBODY SCREEN: NEGATIVE

## 2016-07-30 LAB — MICROSCOPIC EXAMINATION
Bacteria, UA: NONE SEEN
Casts: NONE SEEN /lpf

## 2016-07-30 LAB — HIV ANTIBODY (ROUTINE TESTING W REFLEX): HIV SCREEN 4TH GENERATION: NONREACTIVE

## 2016-07-30 LAB — HEPATITIS B SURFACE ANTIGEN: Hepatitis B Surface Ag: NEGATIVE

## 2016-07-30 LAB — RPR: RPR Ser Ql: NONREACTIVE

## 2016-07-30 LAB — GC/CHLAMYDIA PROBE AMP
CHLAMYDIA, DNA PROBE: NEGATIVE
NEISSERIA GONORRHOEAE BY PCR: NEGATIVE

## 2016-07-31 ENCOUNTER — Encounter: Payer: Self-pay | Admitting: Advanced Practice Midwife

## 2016-07-31 DIAGNOSIS — D573 Sickle-cell trait: Secondary | ICD-10-CM | POA: Insufficient documentation

## 2016-07-31 LAB — URINE CULTURE

## 2016-08-05 ENCOUNTER — Ambulatory Visit (INDEPENDENT_AMBULATORY_CARE_PROVIDER_SITE_OTHER): Payer: Medicaid Other | Admitting: Obstetrics and Gynecology

## 2016-08-05 VITALS — BP 140/90 | HR 92 | Wt 202.2 lb

## 2016-08-05 DIAGNOSIS — Z1389 Encounter for screening for other disorder: Secondary | ICD-10-CM | POA: Diagnosis not present

## 2016-08-05 DIAGNOSIS — Z331 Pregnant state, incidental: Secondary | ICD-10-CM | POA: Diagnosis not present

## 2016-08-05 DIAGNOSIS — O09892 Supervision of other high risk pregnancies, second trimester: Secondary | ICD-10-CM

## 2016-08-05 DIAGNOSIS — Z3A16 16 weeks gestation of pregnancy: Secondary | ICD-10-CM | POA: Diagnosis not present

## 2016-08-05 DIAGNOSIS — O0942 Supervision of pregnancy with grand multiparity, second trimester: Secondary | ICD-10-CM

## 2016-08-05 DIAGNOSIS — Z3492 Encounter for supervision of normal pregnancy, unspecified, second trimester: Secondary | ICD-10-CM

## 2016-08-05 DIAGNOSIS — I2699 Other pulmonary embolism without acute cor pulmonale: Secondary | ICD-10-CM | POA: Diagnosis not present

## 2016-08-05 DIAGNOSIS — O88812 Other embolism in pregnancy, second trimester: Secondary | ICD-10-CM

## 2016-08-05 DIAGNOSIS — O0991 Supervision of high risk pregnancy, unspecified, first trimester: Secondary | ICD-10-CM

## 2016-08-05 LAB — POCT URINALYSIS DIPSTICK
Blood, UA: NEGATIVE
Glucose, UA: NEGATIVE
Glucose, UA: NEGATIVE
KETONES UA: NEGATIVE
Leukocytes, UA: NEGATIVE
Nitrite, UA: NEGATIVE
PROTEIN UA: NEGATIVE
Protein, UA: NEGATIVE
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
## 2016-08-19 ENCOUNTER — Encounter: Payer: Medicaid Other | Admitting: Advanced Practice Midwife

## 2016-08-25 ENCOUNTER — Encounter: Payer: Self-pay | Admitting: Advanced Practice Midwife

## 2016-08-25 ENCOUNTER — Ambulatory Visit (INDEPENDENT_AMBULATORY_CARE_PROVIDER_SITE_OTHER): Payer: Medicaid Other | Admitting: Advanced Practice Midwife

## 2016-08-25 VITALS — BP 112/62 | HR 98 | Wt 202.5 lb

## 2016-08-25 DIAGNOSIS — Z3682 Encounter for antenatal screening for nuchal translucency: Secondary | ICD-10-CM

## 2016-08-25 DIAGNOSIS — O09892 Supervision of other high risk pregnancies, second trimester: Secondary | ICD-10-CM | POA: Diagnosis not present

## 2016-08-25 DIAGNOSIS — Z1389 Encounter for screening for other disorder: Secondary | ICD-10-CM | POA: Diagnosis not present

## 2016-08-25 DIAGNOSIS — Z331 Pregnant state, incidental: Secondary | ICD-10-CM | POA: Diagnosis not present

## 2016-08-25 DIAGNOSIS — O88812 Other embolism in pregnancy, second trimester: Secondary | ICD-10-CM

## 2016-08-25 DIAGNOSIS — Z363 Encounter for antenatal screening for malformations: Secondary | ICD-10-CM

## 2016-08-25 DIAGNOSIS — Z3A19 19 weeks gestation of pregnancy: Secondary | ICD-10-CM

## 2016-08-25 DIAGNOSIS — Z86711 Personal history of pulmonary embolism: Secondary | ICD-10-CM

## 2016-08-25 LAB — POCT URINALYSIS DIPSTICK
GLUCOSE UA: NEGATIVE
Ketones, UA: NEGATIVE
LEUKOCYTES UA: NEGATIVE
Nitrite, UA: NEGATIVE
Protein, UA: NEGATIVE
RBC UA: NEGATIVE

## 2016-08-25 NOTE — Progress Notes (Signed)
Fetal Surveillance Testing today:  doppler   High Risk Pregnancy Diagnosis(es):   Hx PE, on Lovenox  [id number] 8562w4d Estimated Date of Delivery: 01/22/17  Blood pressure 112/62, pulse 98, weight 202 lb 8 oz (91.9 kg).  Urinalysis: Negative   HPI: The patient is being seen today for ongoing management of the above. Today she reports no [id number]   BP weight and urine results all reviewed and noted. Patient reports good fetal movement, denies any bleeding and no rupture of membranes symptoms or regular contractions.  Fundal Height:  u-2 Fetal Heart rate:  150 Edema:  no  Patient is without complaints other than noted in her HPI. All questions were answered.  All lab and sonogram results have been reviewed. Comments:  NT/IT neg  Assessment:  1.  Pregnancy at 2962w4d,  Estimated Date of Delivery: 01/22/17 :                          2.  Hx of PE                        3.    Medication(s) Plans:  Continue Lovenox 80mg /day  Treatment Plan:  2nd IT today  Return in about 1 week (around 09/01/2016) for MV:HQIONGES:Anatomy only;; 4 weeks HROB. for appointment for high risk OB care  No orders of the defined types were placed in this encounter.  Orders Placed This Encounter  Procedures  . US OB Comp + 14 Wk  . Maternal Screen, Integrated #2  . POCT urinalysis dipstick

## 2016-08-27 LAB — MATERNAL SCREEN, INTEGRATED #2
AFP MARKER: 48.1 ng/mL
AFP MoM: 1.41
CROWN RUMP LENGTH: 73 mm
DIA MOM: 0.89
DIA Value: 133.5 pg/mL
Estriol, Unconjugated: 1.29 ng/mL
Gest. Age on Collection Date: 13.1 weeks
Gestational Age: 18.3 weeks
Maternal Age at EDD: 25.3 years
NUCHAL TRANSLUCENCY MOM: 0.95
Nuchal Translucency (NT): 1.7 mm
Number of Fetuses: 1
PAPP-A MOM: 0.98
PAPP-A Value: 844.5 ng/mL
TEST RESULTS: NEGATIVE
Weight: 198 [lb_av]
Weight: 198 [lb_av]
hCG MoM: 1.06
hCG Value: 21 IU/mL
uE3 MoM: 1.05

## 2016-09-01 ENCOUNTER — Other Ambulatory Visit: Payer: Medicaid Other

## 2016-09-22 ENCOUNTER — Encounter: Payer: Medicaid Other | Admitting: Obstetrics & Gynecology

## 2017-01-15 ENCOUNTER — Encounter: Payer: Self-pay | Admitting: *Deleted

## 2017-01-15 ENCOUNTER — Ambulatory Visit: Payer: Medicaid Other | Admitting: Adult Health

## 2017-03-08 ENCOUNTER — Telehealth: Payer: Self-pay | Admitting: *Deleted

## 2017-03-08 NOTE — Telephone Encounter (Signed)
Attempted to call patient to check on delivery status but VM not set up.

## 2017-04-06 ENCOUNTER — Encounter: Payer: Self-pay | Admitting: Obstetrics and Gynecology

## 2017-04-12 ENCOUNTER — Ambulatory Visit (INDEPENDENT_AMBULATORY_CARE_PROVIDER_SITE_OTHER): Payer: Medicaid Other | Admitting: Adult Health

## 2017-04-12 ENCOUNTER — Encounter: Payer: Self-pay | Admitting: Adult Health

## 2017-04-12 VITALS — BP 112/64 | HR 72 | Ht 66.0 in | Wt 213.0 lb

## 2017-04-12 DIAGNOSIS — Z86711 Personal history of pulmonary embolism: Secondary | ICD-10-CM

## 2017-04-12 DIAGNOSIS — Z3202 Encounter for pregnancy test, result negative: Secondary | ICD-10-CM

## 2017-04-12 DIAGNOSIS — Z30011 Encounter for initial prescription of contraceptive pills: Secondary | ICD-10-CM | POA: Diagnosis not present

## 2017-04-12 LAB — POCT URINE PREGNANCY: PREG TEST UR: NEGATIVE

## 2017-04-12 MED ORDER — NORETHINDRONE 0.35 MG PO TABS: 1.0000 | ORAL_TABLET | Freq: Every day | ORAL | 11 refills | Status: DC

## 2017-04-12 NOTE — Progress Notes (Signed)
Subjective:     Patient ID: [name], female   DOB: 1991-10-01, 26 y.o.   MRN: [id number]  HPI [name] is a 26 year old black female in to discuss birth control.She had PE in 2012, had negative work up with Dr Mariel SleetNeijstrom.  PCP is Dr Sudie BaileyKnowlton.   Review of Systems Patient denies any headaches, hearing loss, fatigue, blurred vision, shortness of breath, chest pain, abdominal pain, problems with bowel movements, urination, or intercourse. No joint pain or mood swings. Reviewed past medical,surgical, social and family history. Reviewed medications and allergies.     Objective:   Physical Exam BP 112/64 (BP Location: Left Arm, Patient Position: Sitting, Cuff Size: Normal)   Pulse 72   Ht 5\' 6"  (1.676 m)   Wt 213 lb (96.6 kg)   LMP 03/29/2017   Breastfeeding? Unknown   BMI 34.38 kg/m   UPT negative. Skin warm and dry. Lungs: clear to ausculation bilaterally. Cardiovascular: regular rate and rhythm. Discussed options, POP,mirena IUD, nexplanon, and para gard and tubal, will try POP for now.    Assessment:     1. Encounter for initial prescription of contraceptive pills   2. History of pulmonary embolus (PE)       Plan:     Rx Micronor disp 1 pack take 1 daily with 11 refills Follow up in 3 months Use condoms for 1 pack

## 2017-05-27 ENCOUNTER — Telehealth: Payer: Self-pay | Admitting: *Deleted

## 2017-05-27 NOTE — Telephone Encounter (Signed)
Left message I returned call, try PCP or ER

## 2017-05-27 NOTE — Telephone Encounter (Signed)
Patient called stating she is having chills, back pain and pelvic pain which gets worse when she sits down. She has not checked her temp but feels like she may have a fever. Doesn't know if she can be seen today or if she should go to the ER. Please advise.

## 2017-05-28 ENCOUNTER — Telehealth: Payer: Self-pay | Admitting: Obstetrics and Gynecology

## 2017-05-28 DIAGNOSIS — N3000 Acute cystitis without hematuria: Secondary | ICD-10-CM

## 2017-05-28 MED ORDER — NITROFURANTOIN MONOHYD MACRO 100 MG PO CAPS: 100.0000 mg | ORAL_CAPSULE | Freq: Two times a day (BID) | ORAL | 0 refills | Status: DC

## 2017-05-28 NOTE — Telephone Encounter (Signed)
Pt has begun tx with partial Rx for macrodantin for uti. Sx include malaise, low grade fever,pelvic discomfort. Hx recent uti incompletely treated. Pt has restarted Macrodantin. Pt needs full 7 day tx. 7 day rx escribed.

## 2017-07-13 ENCOUNTER — Ambulatory Visit: Payer: Medicaid Other | Admitting: Adult Health

## 2018-03-21 ENCOUNTER — Telehealth: Payer: Self-pay | Admitting: *Deleted

## 2018-03-21 DIAGNOSIS — O26891 Other specified pregnancy related conditions, first trimester: Secondary | ICD-10-CM

## 2018-03-21 DIAGNOSIS — R102 Pelvic and perineal pain: Secondary | ICD-10-CM

## 2018-03-21 DIAGNOSIS — O3680X0 Pregnancy with inconclusive fetal viability, not applicable or unspecified: Secondary | ICD-10-CM

## 2018-03-21 NOTE — Telephone Encounter (Signed)
States she is have some left sided pain that is dull in nature but earlier was sharp.  She is not having any bleeding today but did have some light spotting a couple of days ago along with the pain.  Periods are irregular but LMP was December but pregnancy tests negative until March 14.  Please advise.

## 2018-03-21 NOTE — Telephone Encounter (Signed)
+  HPT, spotted several days ago, having pain  Lowe pelvic 3-4 now was 8, to come in am at 10:30 for [place]

## 2018-03-22 ENCOUNTER — Ambulatory Visit (INDEPENDENT_AMBULATORY_CARE_PROVIDER_SITE_OTHER): Payer: BLUE CROSS/BLUE SHIELD

## 2018-03-22 DIAGNOSIS — O26891 Other specified pregnancy related conditions, first trimester: Secondary | ICD-10-CM

## 2018-03-22 DIAGNOSIS — O9989 Other specified diseases and conditions complicating pregnancy, childbirth and the puerperium: Secondary | ICD-10-CM

## 2018-03-22 DIAGNOSIS — R102 Pelvic and perineal pain: Secondary | ICD-10-CM | POA: Diagnosis not present

## 2018-03-22 DIAGNOSIS — Z3A10 10 weeks gestation of pregnancy: Secondary | ICD-10-CM

## 2018-03-22 DIAGNOSIS — O3680X0 Pregnancy with inconclusive fetal viability, not applicable or unspecified: Secondary | ICD-10-CM

## 2018-03-22 NOTE — Progress Notes (Signed)
US 10+1 wks,single IUP w/ys,positive fht 169 bpm,normal ovaries,crl 33.48 mm

## 2018-04-05 ENCOUNTER — Encounter: Payer: Self-pay | Admitting: Women's Health

## 2018-04-05 ENCOUNTER — Ambulatory Visit: Payer: BLUE CROSS/BLUE SHIELD | Admitting: *Deleted

## 2018-04-05 ENCOUNTER — Encounter: Payer: BLUE CROSS/BLUE SHIELD | Admitting: Women's Health

## 2018-05-03 ENCOUNTER — Encounter: Payer: Self-pay | Admitting: Women's Health

## 2018-05-03 ENCOUNTER — Ambulatory Visit (INDEPENDENT_AMBULATORY_CARE_PROVIDER_SITE_OTHER): Payer: BLUE CROSS/BLUE SHIELD | Admitting: Women's Health

## 2018-05-03 ENCOUNTER — Ambulatory Visit: Payer: BLUE CROSS/BLUE SHIELD | Admitting: *Deleted

## 2018-05-03 VITALS — BP 112/80 | HR 76 | Wt 214.0 lb

## 2018-05-03 DIAGNOSIS — D573 Sickle-cell trait: Secondary | ICD-10-CM

## 2018-05-03 DIAGNOSIS — Z3490 Encounter for supervision of normal pregnancy, unspecified, unspecified trimester: Secondary | ICD-10-CM | POA: Insufficient documentation

## 2018-05-03 DIAGNOSIS — Z3482 Encounter for supervision of other normal pregnancy, second trimester: Secondary | ICD-10-CM

## 2018-05-03 DIAGNOSIS — Z1389 Encounter for screening for other disorder: Secondary | ICD-10-CM

## 2018-05-03 DIAGNOSIS — Z1379 Encounter for other screening for genetic and chromosomal anomalies: Secondary | ICD-10-CM

## 2018-05-03 DIAGNOSIS — Z331 Pregnant state, incidental: Secondary | ICD-10-CM

## 2018-05-03 DIAGNOSIS — Z3A16 16 weeks gestation of pregnancy: Secondary | ICD-10-CM

## 2018-05-03 DIAGNOSIS — O99012 Anemia complicating pregnancy, second trimester: Secondary | ICD-10-CM

## 2018-05-03 DIAGNOSIS — B009 Herpesviral infection, unspecified: Secondary | ICD-10-CM

## 2018-05-03 LAB — POCT URINALYSIS DIPSTICK
Appearance: NEGATIVE
Blood, UA: NEGATIVE
Glucose, UA: NEGATIVE
Ketones, UA: NEGATIVE
Nitrite, UA: NEGATIVE
Protein, UA: NEGATIVE

## 2018-05-03 MED ORDER — ENOXAPARIN SODIUM 80 MG/0.8ML ~~LOC~~ SOLN: 80.0000 mg | SUBCUTANEOUS | 6 refills | Status: DC

## 2018-05-03 NOTE — Patient Instructions (Signed)
[name], I greatly value your feedback.  If you receive a survey following your visit with [place] today, we appreciate you taking the time to fill it out.  Thanks, [name], CNM, WHNP-BC   Nausea & Vomiting  Have saltine crackers or pretzels by your bed and eat a few bites before you raise your head out of bed in the morning  Eat small frequent meals throughout the day instead of large meals  Drink plenty of fluids throughout the day to stay hydrated, just don't drink a lot of fluids with your meals.  This can make your stomach fill up faster making you feel sick  Do not brush your teeth right after you eat  Products with real ginger are good for nausea, like ginger ale and ginger hard candy Make sure it says made with real ginger!  Sucking on sour candy like lemon heads is also good for nausea  If your prenatal vitamins make you nauseated, take them at night so you will sleep through the nausea  Sea Bands  If you feel like you need medicine for the nausea & vomiting please let us know  If you are unable to keep any fluids or food down please let us know   Constipation  Drink plenty of fluid, preferably water, throughout the day  Eat foods high in fiber such as fruits, vegetables, and grains  Exercise, such as walking, is a good way to keep your bowels regular  Drink warm fluids, especially warm prune juice, or decaf coffee  Eat a 1/2 cup of real oatmeal (not instant), 1/2 cup applesauce, and 1/2-1 cup warm prune juice every day  If needed, you may take Colace (docusate sodium) stool softener once or twice a day to help keep the stool soft. If you are pregnant, wait until you are out of your first trimester (12-14 weeks of pregnancy)  If you still are having problems with constipation, you may take Miralax once daily as needed to help keep your bowels regular.  If you are pregnant, wait until you are out of your first trimester (12-14 weeks of pregnancy)   Second  Trimester of Pregnancy The second trimester is from week 14 through week 27 (months 4 through 6). The second trimester is often a time when you feel your best. Your body has adjusted to being pregnant, and you begin to feel better physically. Usually, morning sickness has lessened or quit completely, you may have more energy, and you may have an increase in appetite. The second trimester is also a time when the fetus is growing rapidly. At the end of the sixth month, the fetus is about 9 inches long and weighs about 1 pounds. You will likely begin to feel the baby move (quickening) between 16 and 20 weeks of pregnancy. Body changes during your second trimester Your body continues to go through many changes during your second trimester. The changes vary from woman to woman.  Your weight will continue to increase. You will notice your lower abdomen bulging out.  You may begin to get stretch marks on your hips, abdomen, and breasts.  You may develop headaches that can be relieved by medicines. The medicines should be approved by your health care provider.  You may urinate more often because the fetus is pressing on your bladder.  You may develop or continue to have heartburn as a result of your pregnancy.  You may develop constipation because certain hormones are causing the muscles that push waste  through your intestines to slow down.  You may develop hemorrhoids or swollen, bulging veins (varicose veins).  You may have back pain. This is caused by: ? Weight gain. ? Pregnancy hormones that are relaxing the joints in your pelvis. ? A shift in weight and the muscles that support your balance.  Your breasts will continue to grow and they will continue to become tender.  Your gums may bleed and may be sensitive to brushing and flossing.  Dark spots or blotches (chloasma, mask of pregnancy) may develop on your face. This will likely fade after the baby is born.  A dark line from your belly  button to the pubic area (linea nigra) may appear. This will likely fade after the baby is born.  You may have changes in your hair. These can include thickening of your hair, rapid growth, and changes in texture. Some women also have hair loss during or after pregnancy, or hair that feels dry or thin. Your hair will most likely return to normal after your baby is born.  What to expect at prenatal visits During a routine prenatal visit:  You will be weighed to make sure you and the fetus are growing normally.  Your blood pressure will be taken.  Your abdomen will be measured to track your baby's growth.  The fetal heartbeat will be listened to.  Any test results from the previous visit will be discussed.  Your health care provider may ask you:  How you are feeling.  If you are feeling the baby move.  If you have had any abnormal symptoms, such as leaking fluid, bleeding, severe headaches, or abdominal cramping.  If you are using any tobacco products, including cigarettes, chewing tobacco, and electronic cigarettes.  If you have any questions.  Other tests that may be performed during your second trimester include:  Blood tests that check for: ? Low iron levels (anemia). ? High blood sugar that affects pregnant women (gestational diabetes) between 78 and 28 weeks. ? Rh antibodies. This is to check for a protein on red blood cells (Rh factor).  Urine tests to check for infections, diabetes, or protein in the urine.  An ultrasound to confirm the proper growth and development of the baby.  An amniocentesis to check for possible genetic problems.  Fetal screens for spina bifida and Down syndrome.  HIV (human immunodeficiency virus) testing. Routine prenatal testing includes screening for HIV, unless you choose not to have this test.  Follow these instructions at home: Medicines  Follow your health care provider's instructions regarding medicine use. Specific medicines may  be either safe or unsafe to take during pregnancy.  Take a prenatal vitamin that contains at least 600 micrograms (mcg) of folic acid.  If you develop constipation, try taking a stool softener if your health care provider approves. Eating and drinking  Eat a balanced diet that includes fresh fruits and vegetables, whole grains, good sources of protein such as meat, eggs, or tofu, and low-fat dairy. Your health care provider will help you determine the amount of weight gain that is right for you.  Avoid raw meat and uncooked cheese. These carry germs that can cause birth defects in the baby.  If you have low calcium intake from food, talk to your health care provider about whether you should take a daily calcium supplement.  Limit foods that are high in fat and processed sugars, such as fried and sweet foods.  To prevent constipation: ? Drink enough fluid to keep  your urine clear or pale yellow. ? Eat foods that are high in fiber, such as fresh fruits and vegetables, whole grains, and beans. Activity  Exercise only as directed by your health care provider. Most women can continue their usual exercise routine during pregnancy. Try to exercise for 30 minutes at least 5 days a week. Stop exercising if you experience uterine contractions.  Avoid heavy lifting, wear low heel shoes, and practice good posture.  A sexual relationship may be continued unless your health care provider directs you otherwise. Relieving pain and discomfort  Wear a good support bra to prevent discomfort from breast tenderness.  Take warm sitz baths to soothe any pain or discomfort caused by hemorrhoids. Use hemorrhoid cream if your health care provider approves.  Rest with your legs elevated if you have leg cramps or low back pain.  If you develop varicose veins, wear support hose. Elevate your feet for 15 minutes, 3-4 times a day. Limit salt in your diet. Prenatal Care  Write down your questions. Take them to  your prenatal visits.  Keep all your prenatal visits as told by your health care provider. This is important. Safety  Wear your seat belt at all times when driving.  Make a list of emergency phone numbers, including numbers for family, friends, the hospital, and police and fire departments. General instructions  Ask your health care provider for a referral to a local prenatal education class. Begin classes no later than the beginning of month 6 of your pregnancy.  Ask for help if you have counseling or nutritional needs during pregnancy. Your health care provider can offer advice or refer you to specialists for help with various needs.  Do not use hot tubs, steam rooms, or saunas.  Do not douche or use tampons or scented sanitary pads.  Do not cross your legs for long periods of time.  Avoid cat litter boxes and soil used by cats. These carry germs that can cause birth defects in the baby and possibly loss of the fetus by miscarriage or stillbirth.  Avoid all smoking, herbs, alcohol, and unprescribed drugs. Chemicals in these products can affect the formation and growth of the baby.  Do not use any products that contain nicotine or tobacco, such as cigarettes and e-cigarettes. If you need help quitting, ask your health care provider.  Visit your dentist if you have not gone yet during your pregnancy. Use a soft toothbrush to brush your teeth and be gentle when you floss. Contact a health care provider if:  You have dizziness.  You have mild pelvic cramps, pelvic pressure, or nagging pain in the abdominal area.  You have persistent nausea, vomiting, or diarrhea.  You have a bad smelling vaginal discharge.  You have pain when you urinate. Get help right away if:  You have a fever.  You are leaking fluid from your vagina.  You have spotting or bleeding from your vagina.  You have severe abdominal cramping or pain.  You have rapid weight gain or weight loss.  You have  shortness of breath with chest pain.  You notice sudden or extreme swelling of your face, hands, ankles, feet, or legs.  You have not felt your baby move in over an hour.  You have severe headaches that do not go away when you take medicine.  You have vision changes. Summary  The second trimester is from week 14 through week 27 (months 4 through 6). It is also a time when the fetus  is growing rapidly.  Your body goes through many changes during pregnancy. The changes vary from woman to woman.  Avoid all smoking, herbs, alcohol, and unprescribed drugs. These chemicals affect the formation and growth your baby.  Do not use any tobacco products, such as cigarettes, chewing tobacco, and e-cigarettes. If you need help quitting, ask your health care provider.  Contact your health care provider if you have any questions. Keep all prenatal visits as told by your health care provider. This is important. This information is not intended to replace advice given to you by your health care provider. Make sure you discuss any questions you have with your health care provider. Document Released: 11/17/2001 Document Revised: 12/29/2016 Document Reviewed: 12/29/2016 Elsevier Interactive Patient Education  [employer].

## 2018-05-03 NOTE — Progress Notes (Signed)
INITIAL OBSTETRICAL VISIT Patient name: [name] MRN [id number]  Date of birth: [birth year] Chief Complaint:   Initial Prenatal Visit  History of Present Illness:   [name] is a 27 y.o. [id number] African American female at [redacted]w[redacted]d by 10wk u/s, with an Estimated Date of Delivery: 10/17/18 being seen today for her initial obstetrical visit.   Her obstetrical history is significant for term uncomplicated SVB x 2, EAB x 3.  H/O unproved PE, neg work-up, however her father also had PE.  Today she reports no complaints. 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  No LMP recorded (lmp unknown). Patient is pregnant. Last pap 02/06/16. Results were: normal Review of Systems:   Pertinent items are noted in HPI Denies cramping/contractions, leakage of fluid, vaginal bleeding, abnormal vaginal discharge w/ itching/odor/irritation, headaches, visual changes, shortness of breath, chest pain, abdominal pain, severe nausea/vomiting, or problems with urination or bowel movements unless otherwise stated above.  Pertinent History Reviewed:  Reviewed past medical,surgical, social, obstetrical and family history.  Reviewed problem list, medications and allergies. OB History  Gravida Para Term Preterm AB Living  SAB TAB Ectopic Multiple Live Births    3     2    # Outcome Date GA Lbr Len/2nd Weight Sex Delivery Anes PTL Lv  6 Current           5 TAB 2016          4 Term 06/12/14 [redacted]w[redacted]d -10:38 / 00:50 7 lb 7.9 oz (3.399 kg) F Vag-Spont EPI N LIV     Birth Comments: caput  3 TAB 2013          2 TAB 2012          1 Term 11/13/09 [redacted]w[redacted]d  6 lb 5 oz (2.863 kg) M Vag-Spont EPI N LIV   Physical Assessment:   Vitals:   05/03/18 1549  BP: 112/80  Pulse: 76  Weight: 214 lb (97.1 kg)  Body mass index is 34.54 kg/m.       Physical Examination:  General appearance - well appearing, and in no distress  Mental status - alert, oriented to person, place, and time  Psych:  She has a  normal mood and affect  Skin - warm and dry, normal color, no suspicious lesions noted  Chest - effort normal, all lung fields clear to auscultation bilaterally  Heart - normal rate and regular rhythm  Abdomen - soft, nontender  Extremities:  No swelling or varicosities noted  Thin prep pap is not done  Fetal Heart Rate (bpm): 155 via doppler  Results for orders placed or performed in visit on 05/03/18 (from the past 24 hour(s))  POCT urinalysis dipstick   Collection Time: 05/03/18  4:13 PM  Result Value Ref Range   Color, UA     Clarity, UA     Glucose, UA Negative Negative   Bilirubin, UA     Ketones, UA neg    Spec Grav, UA  1.010 - 1.025   Blood, UA neg    pH, UA  5.0 - 8.0   Protein, UA Negative Negative   Urobilinogen, UA  0.2 or 1.0 E.U./dL   Nitrite, UA neg    Leukocytes, UA  Negative   Appearance neg    Odor    Obstetric Panel, Including HIV   Collection Time: 05/03/18  5:12 PM  Result Value Ref Range   Hepatitis B Surface  Ag Negative Negative   RPR Ser Ql Non Reactive Non Reactive   Rubella Antibodies, IGG 2.18 Immune >0.99 index   ABO Grouping A    Rh Factor Positive    Antibody Screen Negative Negative   HIV Screen 4th Generation wRfx Non Reactive Non Reactive   WBC 11.6 (H) 3.4 - 10.8 x10E3/uL   RBC 4.46 3.77 - 5.28 x10E6/uL   Hemoglobin 11.7 11.1 - 15.9 g/dL   Hematocrit 16.1 09.6 - 46.6 %   MCV 80 79 - 97 fL   MCH 26.2 (L) 26.6 - 33.0 pg   MCHC 32.7 31.5 - 35.7 g/dL   RDW 04.5 (H) 40.9 - 81.1 %   Platelets 294 150 - 450 x10E3/uL   Neutrophils 70 Not Estab. %   Lymphs 22 Not Estab. %   Monocytes 7 Not Estab. %   Eos 1 Not Estab. %   Basos 0 Not Estab. %   Neutrophils Absolute 8.1 (H) 1.4 - 7.0 x10E3/uL   Lymphocytes Absolute 2.6 0.7 - 3.1 x10E3/uL   Monocytes Absolute 0.8 0.1 - 0.9 x10E3/uL   EOS (ABSOLUTE) 0.1 0.0 - 0.4 x10E3/uL   Basophils Absolute 0.0 0.0 - 0.2 x10E3/uL   Immature Granulocytes 0 Not Estab. 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 enoxaparin (LOVENOX) 80 MG/0.8ML injection    Sig: Inject 0.8 mLs (80 mg total) into the skin daily.    Dispense:  30 Syringe    Refill:  6    Order Specific Question:   Supervising Provider    Answer:   Lazaro Arms [[id number]]    Initial labs obtained Continue prenatal vitamins Reviewed n/v relief measures and warning s/s to report Reviewed recommended weight gain based on pre-gravid BMI Encouraged well-balanced diet Genetic Screening discussed Quad Screen: requested Cystic fibrosis screening discussed neg prev preg Ultrasound discussed; fetal survey: requested CCNC completed> spoke w/ Jasmine  Follow-up: Return in about 2 weeks (around 05/17/2018) for LROB, [id number].   Orders Placed This Encounter  Procedures  . GC/Chlamydia Probe Amp  . Urine Culture  . Pain Management Screening Profile (10S)  . AFP TETRA  . Obstetric Panel, Including HIV  . POCT urinalysis dipstick    Cheral Marker CNM, [facility] 05/03/18

## 2018-05-04 LAB — OBSTETRIC PANEL, INCLUDING HIV
Antibody Screen: NEGATIVE
BASOS ABS: 0 10*3/uL (ref 0.0–0.2)
Basos: 0 %
EOS (ABSOLUTE): 0.1 10*3/uL (ref 0.0–0.4)
EOS: 1 %
HEMATOCRIT: 35.8 % (ref 34.0–46.6)
HEMOGLOBIN: 11.7 g/dL (ref 11.1–15.9)
HEP B S AG: NEGATIVE
HIV SCREEN 4TH GENERATION: NONREACTIVE
Immature Grans (Abs): 0 10*3/uL (ref 0.0–0.1)
Immature Granulocytes: 0 %
Lymphocytes Absolute: 2.6 10*3/uL (ref 0.7–3.1)
Lymphs: 22 %
MCH: 26.2 pg — ABNORMAL LOW (ref 26.6–33.0)
MCHC: 32.7 g/dL (ref 31.5–35.7)
MCV: 80 fL (ref 79–97)
MONOCYTES: 7 %
Monocytes Absolute: 0.8 10*3/uL (ref 0.1–0.9)
NEUTROS ABS: 8.1 10*3/uL — AB (ref 1.4–7.0)
Neutrophils: 70 %
PLATELETS: 294 10*3/uL (ref 150–450)
RBC: 4.46 x10E6/uL (ref 3.77–5.28)
RDW: 16.1 % — ABNORMAL HIGH (ref 12.3–15.4)
RPR: NONREACTIVE
RUBELLA: 2.18 {index} (ref >0.99)
Rh Factor: POSITIVE
WBC: 11.6 10*3/uL — ABNORMAL HIGH (ref 3.4–10.8)

## 2018-05-04 LAB — PMP SCREEN PROFILE (10S), URINE
Amphetamine Scrn, Ur: NEGATIVE ng/mL
BARBITURATE SCREEN URINE: NEGATIVE ng/mL
BENZODIAZEPINE SCREEN, URINE: NEGATIVE ng/mL
CANNABINOIDS UR QL SCN: NEGATIVE ng/mL
CREATININE(CRT), U: 78.3 mg/dL (ref 20.0–300.0)
Cocaine (Metab) Scrn, Ur: NEGATIVE ng/mL
METHADONE SCREEN, URINE: NEGATIVE ng/mL
OXYCODONE+OXYMORPHONE UR QL SCN: NEGATIVE ng/mL
Opiate Scrn, Ur: NEGATIVE ng/mL
PH UR, DRUG SCRN: 7.1 (ref 4.5–8.9)
PROPOXYPHENE SCREEN URINE: NEGATIVE ng/mL
Phencyclidine Qn, Ur: NEGATIVE ng/mL

## 2018-05-04 LAB — MED LIST OPTION NOT SELECTED

## 2018-05-05 LAB — GC/CHLAMYDIA PROBE AMP
Chlamydia trachomatis, NAA: NEGATIVE
Neisseria gonorrhoeae by PCR: NEGATIVE

## 2018-05-05 LAB — SPECIMEN STATUS REPORT

## 2018-05-06 LAB — AFP TETRA
DIA Mom Value: 0.96
DIA Value (EIA): 137.69 pg/mL
DSR (BY AGE) 1 IN: 930
DSR (Second Trimester) 1 IN: 10000
Gestational Age: 16.1 WEEKS
MATERNAL AGE AT EDD: 26.9 a
MSAFP MOM: 1.28
MSAFP: 37.2 ng/mL
MSHCG MOM: 0.81
MSHCG: 27472 m[IU]/mL
OSB RISK: 10000
T18 (By Age): 1:3625 {titer}
TEST RESULTS AFP: NEGATIVE
UE3 VALUE: 0.84 ng/mL
Weight: 214 [lb_av]
uE3 Mom: 1.09

## 2018-05-06 LAB — URINE CULTURE

## 2018-05-06 LAB — SPECIMEN STATUS REPORT

## 2018-05-27 ENCOUNTER — Other Ambulatory Visit: Payer: Self-pay | Admitting: Women's Health

## 2018-05-27 DIAGNOSIS — Z363 Encounter for antenatal screening for malformations: Secondary | ICD-10-CM

## 2018-05-30 ENCOUNTER — Encounter: Payer: BLUE CROSS/BLUE SHIELD | Admitting: Obstetrics & Gynecology

## 2018-05-30 ENCOUNTER — Ambulatory Visit (INDEPENDENT_AMBULATORY_CARE_PROVIDER_SITE_OTHER): Payer: BLUE CROSS/BLUE SHIELD

## 2018-05-30 ENCOUNTER — Encounter: Payer: Self-pay | Admitting: Obstetrics & Gynecology

## 2018-05-30 VITALS — BP 97/62 | HR 88 | Wt 213.0 lb

## 2018-05-30 DIAGNOSIS — Z3402 Encounter for supervision of normal first pregnancy, second trimester: Secondary | ICD-10-CM

## 2018-05-30 DIAGNOSIS — Z363 Encounter for antenatal screening for malformations: Secondary | ICD-10-CM | POA: Diagnosis not present

## 2018-05-30 LAB — POCT URINALYSIS DIPSTICK
Glucose, UA: NEGATIVE
Ketones, UA: NEGATIVE
LEUKOCYTES UA: NEGATIVE
Nitrite, UA: NEGATIVE
PROTEIN UA: NEGATIVE
RBC UA: NEGATIVE

## 2018-05-30 NOTE — Progress Notes (Signed)
US 20 wks,transverse left,posterior pl gr 0,svp of fluid 5.3 cm,normal ovaries bilat,FHR 137 BPM,CX 4.1 cm,efw 334 g 53%,anatomy complete,no obvious abnormalities

## 2018-05-31 NOTE — Progress Notes (Signed)
This encounter was created in error - please disregard.

## 2018-06-13 ENCOUNTER — Encounter: Payer: BLUE CROSS/BLUE SHIELD | Admitting: Obstetrics and Gynecology

## 2018-06-16 ENCOUNTER — Encounter: Payer: BLUE CROSS/BLUE SHIELD | Admitting: Women's Health

## 2018-07-13 ENCOUNTER — Ambulatory Visit (INDEPENDENT_AMBULATORY_CARE_PROVIDER_SITE_OTHER): Payer: Medicaid Other | Admitting: Obstetrics and Gynecology

## 2018-07-13 VITALS — BP 108/58 | HR 95 | Wt 218.4 lb

## 2018-07-13 DIAGNOSIS — Z1389 Encounter for screening for other disorder: Secondary | ICD-10-CM

## 2018-07-13 DIAGNOSIS — Z331 Pregnant state, incidental: Secondary | ICD-10-CM

## 2018-07-13 DIAGNOSIS — Z3482 Encounter for supervision of other normal pregnancy, second trimester: Secondary | ICD-10-CM

## 2018-07-13 DIAGNOSIS — Z3A26 26 weeks gestation of pregnancy: Secondary | ICD-10-CM

## 2018-07-13 LAB — POCT URINALYSIS DIPSTICK OB
Glucose, UA: NEGATIVE — AB
KETONES UA: NEGATIVE
Leukocytes, UA: NEGATIVE
Nitrite, UA: NEGATIVE
POC,PROTEIN,UA: NEGATIVE
RBC UA: NEGATIVE

## 2018-07-13 NOTE — Progress Notes (Signed)
Patient ID: [name], female   DOB: [birth year], 27 y.o.   MRN: [id number]    LOW-RISK PREGNANCY VISIT Patient name: [name] MRN [id number]  Date of birth: [birth year] Chief Complaint:   Routine Prenatal Visit  History of Present Illness:   [name] is a 27 y.o. [id number] female at 5490w2d with an Estimated Date of Delivery: 10/17/18 being seen today for ongoing management of a low-risk pregnancy. She is considering Tubal or Mirena. She previously had Mirena but had blood clots and had to remove it due to recommendations of hematology. She has hx of Sickle cell trait and DVT. Today she reports no complaints. Contractions: Not present. Vag. Bleeding: None.  Movement: Present. denies leaking of fluid. Review of Systems:   Pertinent items are noted in HPI Denies abnormal vaginal discharge w/ itching/odor/irritation, headaches, visual changes, shortness of breath, chest pain, abdominal pain, severe nausea/vomiting, or problems with urination or bowel movements unless otherwise stated above. Pertinent History Reviewed:  Reviewed past medical,surgical, social, obstetrical and family history.  Reviewed problem list, medications and allergies. Physical Assessment:   Vitals:   07/13/18 1512  BP: (!) 108/58  Pulse: 95  Weight: 218 lb 6.4 oz (99.1 kg)  Body mass index is 35.25 kg/m.        Physical Examination:   General appearance: Well appearing, and in no distress  Mental status: Alert, oriented to person, place, and time  Skin: Warm & dry  Cardiovascular: Normal heart rate noted  Respiratory: Normal respiratory effort, no distress  Abdomen: Soft, gravid, nontender  Pelvic: Cervical exam deferred         Extremities: Edema: None  Fetal Status: Fetal Heart Rate (bpm): 138 Fundal Height: 31 cm Movement: Present    Results for orders placed or performed in visit on 07/13/18 (from the past 24 hour(s))  POC Urinalysis Dipstick OB   Collection Time: 07/13/18  3:17 PM  Result  Value Ref Range   Color, UA     Clarity, UA     Glucose, UA Negative (A) (none)   Bilirubin, UA     Ketones, UA neg    Spec Grav, UA  1.010 - 1.025   Blood, UA neg    pH, UA  5.0 - 8.0   POC Protein UA Negative Negative, Trace   Urobilinogen, UA  0.2 or 1.0 E.U./dL   Nitrite, UA neg    Leukocytes, UA Negative Negative   Appearance     Odor      Assessment & Plan:  1) Low-risk pregnancy [id number] at 6190w2d with an Estimated Date of Delivery: 10/17/18    Meds: No orders of the defined types were placed in this encounter.  Labs/procedures today: None  Plan:  Continue routine obstetrical care           We will investigate current hematologist attitude toward progesterone only pills and patient with a history of PE  Follow-up: No follow-ups on file.  Orders Placed This Encounter  Procedures  . POC Urinalysis Dipstick OB   By signing my name below, I, Arnette NorrisMari Johnson, attest that this documentation has been prepared under the direction and in the presence of Tilda BurrowFerguson, Maddyx Vallie V, MD. Electronically Signed: Arnette NorrisMari Johnson Medical Scribe. 07/13/18. 3:36 PM.  I personally performed the services described in this documentation, which was SCRIBED in my presence. The recorded information has been reviewed and considered accurate. It has been edited as necessary during review. Tilda BurrowJohn V Keleigh Kazee, MD

## 2018-08-10 ENCOUNTER — Encounter: Payer: Self-pay | Admitting: *Deleted

## 2018-08-10 ENCOUNTER — Ambulatory Visit (INDEPENDENT_AMBULATORY_CARE_PROVIDER_SITE_OTHER): Payer: Medicaid Other | Admitting: Women's Health

## 2018-08-10 ENCOUNTER — Encounter: Payer: Self-pay | Admitting: Women's Health

## 2018-08-10 ENCOUNTER — Other Ambulatory Visit: Payer: Medicaid Other

## 2018-08-10 VITALS — BP 116/82 | HR 88 | Wt 223.0 lb

## 2018-08-10 DIAGNOSIS — Z1389 Encounter for screening for other disorder: Secondary | ICD-10-CM

## 2018-08-10 DIAGNOSIS — D573 Sickle-cell trait: Secondary | ICD-10-CM

## 2018-08-10 DIAGNOSIS — Z113 Encounter for screening for infections with a predominantly sexual mode of transmission: Secondary | ICD-10-CM

## 2018-08-10 DIAGNOSIS — Z23 Encounter for immunization: Secondary | ICD-10-CM

## 2018-08-10 DIAGNOSIS — Z3483 Encounter for supervision of other normal pregnancy, third trimester: Secondary | ICD-10-CM

## 2018-08-10 DIAGNOSIS — O88813 Other embolism in pregnancy, third trimester: Secondary | ICD-10-CM

## 2018-08-10 DIAGNOSIS — Z3A30 30 weeks gestation of pregnancy: Secondary | ICD-10-CM

## 2018-08-10 DIAGNOSIS — O9989 Other specified diseases and conditions complicating pregnancy, childbirth and the puerperium: Secondary | ICD-10-CM

## 2018-08-10 DIAGNOSIS — Z331 Pregnant state, incidental: Secondary | ICD-10-CM

## 2018-08-10 LAB — POCT URINALYSIS DIPSTICK OB
GLUCOSE, UA: NEGATIVE
Ketones, UA: NEGATIVE
LEUKOCYTES UA: NEGATIVE
Nitrite, UA: NEGATIVE
POC,PROTEIN,UA: NEGATIVE
RBC UA: NEGATIVE

## 2018-08-10 NOTE — Patient Instructions (Signed)
[name], I greatly value your feedback.  If you receive a survey following your visit with [place] today, we appreciate you taking the time to fill it out.  Thanks, [name], CNM, WHNP-BC   Call the office [phone]) or go to [facility] if:  You begin to have strong, frequent contractions  Your water breaks.  Sometimes it is a big gush of fluid, sometimes it is just a trickle that keeps getting your panties wet or running down your legs  You have vaginal bleeding.  It is normal to have a small amount of spotting if your cervix was checked.   You don't feel your baby moving like normal.  If you don't, get you something to eat and drink and lay down and focus on feeling your baby move.  You should feel at least 10 movements in 2 hours.  If you don't, you should call the office or go to [facility].    Tdap Vaccine  It is recommended that you get the Tdap vaccine during the third trimester of EACH pregnancy to help protect your baby from getting pertussis (whooping cough)  27-36 weeks is the BEST time to do this so that you can pass the protection on to your baby. During pregnancy is better than after pregnancy, but if you are unable to get it during pregnancy it will be offered at the hospital.   You can get this vaccine with [place], at the health department, your family doctor, or some local pharmacies  Everyone who will be around your baby should also be up-to-date on their vaccines before the baby comes. Adults (who are not pregnant) only need 1 dose of Tdap during adulthood.   Third Trimester of Pregnancy The third trimester is from week 29 through week 42, months 7 through 9. The third trimester is a time when the fetus is growing rapidly. At the end of the ninth month, the fetus is about 20 inches in length and weighs 6-10 pounds.  BODY CHANGES Your body goes through many changes during pregnancy. The changes vary from woman to woman.   Your weight will continue to  increase. You can expect to gain 25-35 pounds (11-16 kg) by the end of the pregnancy.  You may begin to get stretch marks on your hips, abdomen, and breasts.  You may urinate more often because the fetus is moving lower into your pelvis and pressing on your bladder.  You may develop or continue to have heartburn as a result of your pregnancy.  You may develop constipation because certain hormones are causing the muscles that push waste through your intestines to slow down.  You may develop hemorrhoids or swollen, bulging veins (varicose veins).  You may have pelvic pain because of the weight gain and pregnancy hormones relaxing your joints between the bones in your pelvis. Backaches may result from overexertion of the muscles supporting your posture.  You may have changes in your hair. These can include thickening of your hair, rapid growth, and changes in texture. Some women also have hair loss during or after pregnancy, or hair that feels dry or thin. Your hair will most likely return to normal after your baby is born.  Your breasts will continue to grow and be tender. A yellow discharge may leak from your breasts called colostrum.  Your belly button may stick out.  You may feel short of breath because of your expanding uterus.  You may notice the fetus "dropping," or moving lower in  your abdomen.  You may have a bloody mucus discharge. This usually occurs a few days to a week before labor begins.  Your cervix becomes thin and soft (effaced) near your due date. WHAT TO EXPECT AT YOUR PRENATAL EXAMS  You will have prenatal exams every 2 weeks until week 36. Then, you will have weekly prenatal exams. During a routine prenatal visit:  You will be weighed to make sure you and the fetus are growing normally.  Your blood pressure is taken.  Your abdomen will be measured to track your baby's growth.  The fetal heartbeat will be listened to.  Any test results from the previous visit  will be discussed.  You may have a cervical check near your due date to see if you have effaced. At around 36 weeks, your caregiver will check your cervix. At the same time, your caregiver will also perform a test on the secretions of the vaginal tissue. This test is to determine if a type of bacteria, Group B streptococcus, is present. Your caregiver will explain this further. Your caregiver may ask you:  What your birth plan is.  How you are feeling.  If you are feeling the baby move.  If you have had any abnormal symptoms, such as leaking fluid, bleeding, severe headaches, or abdominal cramping.  If you have any questions. Other tests or screenings that may be performed during your third trimester include:  Blood tests that check for low iron levels (anemia).  Fetal testing to check the health, activity level, and growth of the fetus. Testing is done if you have certain medical conditions or if there are problems during the pregnancy. FALSE LABOR You may feel small, irregular contractions that eventually go away. These are called Braxton Hicks contractions, or false labor. Contractions may last for hours, days, or even weeks before true labor sets in. If contractions come at regular intervals, intensify, or become painful, it is best to be seen by your caregiver.  SIGNS OF LABOR   Menstrual-like cramps.  Contractions that are 5 minutes apart or less.  Contractions that start on the top of the uterus and spread down to the lower abdomen and back.  A sense of increased pelvic pressure or back pain.  A watery or bloody mucus discharge that comes from the vagina. If you have any of these signs before the 37th week of pregnancy, call your caregiver right away. You need to go to the hospital to get checked immediately. HOME CARE INSTRUCTIONS   Avoid all smoking, herbs, alcohol, and unprescribed drugs. These chemicals affect the formation and growth of the baby.  Follow your  caregiver's instructions regarding medicine use. There are medicines that are either safe or unsafe to take during pregnancy.  Exercise only as directed by your caregiver. Experiencing uterine cramps is a good sign to stop exercising.  Continue to eat regular, healthy meals.  Wear a good support bra for breast tenderness.  Do not use hot tubs, steam rooms, or saunas.  Wear your seat belt at all times when driving.  Avoid raw meat, uncooked cheese, cat litter boxes, and soil used by cats. These carry germs that can cause birth defects in the baby.  Take your prenatal vitamins.  Try taking a stool softener (if your caregiver approves) if you develop constipation. Eat more high-fiber foods, such as fresh vegetables or fruit and whole grains. Drink plenty of fluids to keep your urine clear or pale yellow.  Take warm sitz baths to  soothe any pain or discomfort caused by hemorrhoids. Use hemorrhoid cream if your caregiver approves.  If you develop varicose veins, wear support hose. Elevate your feet for 15 minutes, 3-4 times a day. Limit salt in your diet.  Avoid heavy lifting, wear low heal shoes, and practice good posture.  Rest a lot with your legs elevated if you have leg cramps or low back pain.  Visit your dentist if you have not gone during your pregnancy. Use a soft toothbrush to brush your teeth and be gentle when you floss.  A sexual relationship may be continued unless your caregiver directs you otherwise.  Do not travel far distances unless it is absolutely necessary and only with the approval of your caregiver.  Take prenatal classes to understand, practice, and ask questions about the labor and delivery.  Make a trial run to the hospital.  Pack your hospital bag.  Prepare the baby's nursery.  Continue to go to all your prenatal visits as directed by your caregiver. SEEK MEDICAL CARE IF:  You are unsure if you are in labor or if your water has broken.  You have  dizziness.  You have mild pelvic cramps, pelvic pressure, or nagging pain in your abdominal area.  You have persistent nausea, vomiting, or diarrhea.  You have a bad smelling vaginal discharge.  You have pain with urination. SEEK IMMEDIATE MEDICAL CARE IF:   You have a fever.  You are leaking fluid from your vagina.  You have spotting or bleeding from your vagina.  You have severe abdominal cramping or pain.  You have rapid weight loss or gain.  You have shortness of breath with chest pain.  You notice sudden or extreme swelling of your face, hands, ankles, feet, or legs.  You have not felt your baby move in over an hour.  You have severe headaches that do not go away with medicine.  You have vision changes. Document Released: 11/17/2001 Document Revised: 11/28/2013 Document Reviewed: 01/24/2013 [facility] Patient Information 2015 [place], [place]. This information is not intended to replace advice given to you by your health care provider. Make sure you discuss any questions you have with your health care provider.

## 2018-08-10 NOTE — Progress Notes (Signed)
   LOW-RISK PREGNANCY VISIT Patient name: [name] MRN [id number]  Date of birth: 15-Nov-1991 Chief Complaint:   Routine Prenatal Visit (requests STD screening and bloodwork )  History of Present Illness:   [name] is a 27 y.o. [id number] female at [redacted]w[redacted]d with an Estimated Date of Delivery: 10/17/18 being seen today for ongoing management of a low-risk pregnancy.  Today she reports wants STD screen, separated briefly from husband- back together now, no sx, just wants to make sure. No care from 16 to 26wks, this is 3rd visit this pregnancy. Has not done PN2. Contractions: Irregular. Vag. Bleeding: None.  Movement: Present. denies leaking of fluid. Review of Systems:   Pertinent items are noted in HPI Denies abnormal vaginal discharge w/ itching/odor/irritation, headaches, visual changes, shortness of breath, chest pain, abdominal pain, severe nausea/vomiting, or problems with urination or bowel movements unless otherwise stated above. Pertinent History Reviewed:  Reviewed past medical,surgical, social, obstetrical and family history.  Reviewed problem list, medications and allergies. Physical Assessment:   Vitals:   08/10/18 1538  BP: 116/82  Pulse: 88  Weight: 223 lb (101.2 kg)  Body mass index is 35.99 kg/m.        Physical Examination:   General appearance: Well appearing, and in no distress  Mental status: Alert, oriented to person, place, and time  Skin: Warm & dry  Cardiovascular: Normal heart rate noted  Respiratory: Normal respiratory effort, no distress  Abdomen: Soft, gravid, nontender  Pelvic: Cervical exam deferred         Extremities: Edema: None  Fetal Status: Fetal Heart Rate (bpm): 140 Fundal Height: 31 cm Movement: Present    Results for orders placed or performed in visit on 08/10/18 (from the past 24 hour(s))  POC Urinalysis Dipstick OB   Collection Time: 08/10/18  3:39 PM  Result Value Ref Range   Color, UA     Clarity, UA     Glucose, UA Negative  Negative   Bilirubin, UA     Ketones, UA neg    Spec Grav, UA     Blood, UA neg    pH, UA     POC Protein UA Negative Negative, Trace   Urobilinogen, UA     Nitrite, UA neg    Leukocytes, UA Negative Negative   Appearance     Odor      Assessment & Plan:  1) Low-risk pregnancy [id number] at [redacted]w[redacted]d with an Estimated Date of Delivery: 10/17/18   2) Limited pnc  3) STD screen> gc/ct today, will get HIV, RPR, Hep B w/ PN2  4) H/O PE> on Lovenox 80mg  daily  5) [place] trait> urine cx today   Meds: No orders of the defined types were placed in this encounter.  Labs/procedures today: tdap, declines flu shot  Plan:  Continue routine obstetrical care   Reviewed: Preterm labor symptoms and general obstetric precautions including but not limited to vaginal bleeding, contractions, leaking of fluid and fetal movement were reviewed in detail with the patient.  All questions were answered  Follow-up: Return for asap for pn2 (no visit), then 2wks for LROB.  Orders Placed This Encounter  Procedures  . GC/Chlamydia Probe Amp  . Urine Culture  . POC Urinalysis Dipstick OB   Cheral Marker CNM, [facility] 08/10/2018 4:11 PM

## 2018-08-11 ENCOUNTER — Other Ambulatory Visit: Payer: Medicaid Other

## 2018-08-11 DIAGNOSIS — Z3A30 30 weeks gestation of pregnancy: Secondary | ICD-10-CM

## 2018-08-11 DIAGNOSIS — Z131 Encounter for screening for diabetes mellitus: Secondary | ICD-10-CM

## 2018-08-11 DIAGNOSIS — Z3483 Encounter for supervision of other normal pregnancy, third trimester: Secondary | ICD-10-CM

## 2018-08-11 LAB — GC/CHLAMYDIA PROBE AMP

## 2018-08-12 LAB — CBC
HEMATOCRIT: 38.6 % (ref 34.0–46.6)
HEMOGLOBIN: 12.2 g/dL (ref 11.1–15.9)
MCH: 26.6 pg (ref 26.6–33.0)
MCHC: 31.6 g/dL (ref 31.5–35.7)
MCV: 84 fL (ref 79–97)
Platelets: 250 10*3/uL (ref 150–450)
RBC: 4.58 x10E6/uL (ref 3.77–5.28)
RDW: 16.9 % — ABNORMAL HIGH (ref 12.3–15.4)
WBC: 10.2 10*3/uL (ref 3.4–10.8)

## 2018-08-12 LAB — HIV ANTIBODY (ROUTINE TESTING W REFLEX): HIV Screen 4th Generation wRfx: NONREACTIVE

## 2018-08-12 LAB — RPR: RPR: NONREACTIVE

## 2018-08-12 LAB — GLUCOSE TOLERANCE, 2 HOURS W/ 1HR
Glucose, 1 hour: 157 mg/dL (ref 65–179)
Glucose, 2 hour: 104 mg/dL (ref 65–152)
Glucose, Fasting: 85 mg/dL (ref 65–91)

## 2018-08-12 LAB — URINE CULTURE

## 2018-08-12 LAB — HEPATITIS B SURFACE ANTIGEN: HEP B S AG: NEGATIVE

## 2018-08-12 LAB — ANTIBODY SCREEN: Antibody Screen: NEGATIVE

## 2018-08-24 ENCOUNTER — Encounter: Payer: Medicaid Other | Admitting: Obstetrics and Gynecology

## 2018-09-02 ENCOUNTER — Encounter: Payer: Self-pay | Admitting: Obstetrics & Gynecology

## 2018-09-02 ENCOUNTER — Ambulatory Visit (INDEPENDENT_AMBULATORY_CARE_PROVIDER_SITE_OTHER): Payer: Medicaid Other | Admitting: Obstetrics & Gynecology

## 2018-09-02 ENCOUNTER — Other Ambulatory Visit: Payer: Self-pay

## 2018-09-02 VITALS — BP 122/78 | HR 93 | Wt 222.0 lb

## 2018-09-02 DIAGNOSIS — Z1389 Encounter for screening for other disorder: Secondary | ICD-10-CM

## 2018-09-02 DIAGNOSIS — Z331 Pregnant state, incidental: Secondary | ICD-10-CM

## 2018-09-02 DIAGNOSIS — Z3483 Encounter for supervision of other normal pregnancy, third trimester: Secondary | ICD-10-CM

## 2018-09-02 LAB — POCT URINALYSIS DIPSTICK OB
Blood, UA: NEGATIVE
GLUCOSE, UA: NEGATIVE
Ketones, UA: NEGATIVE
LEUKOCYTES UA: NEGATIVE
NITRITE UA: NEGATIVE
PROTEIN: NEGATIVE

## 2018-09-02 NOTE — Progress Notes (Signed)
[id number] [redacted]w[redacted]d Estimated Date of Delivery: 10/17/18  Blood pressure 122/78, pulse 93, weight 222 lb (100.7 kg), unknown if currently breastfeeding.   BP weight and urine results all reviewed and noted.  Please refer to the obstetrical flow sheet for the fundal height and fetal heart rate documentation:  Patient reports good fetal movement, denies any bleeding and no rupture of membranes symptoms or regular contractions. Patient is without complaints. All questions were answered.  Orders Placed This Encounter  Procedures  . POC Urinalysis Dipstick OB    Plan:  Continued routine obstetrical care, continue lovenox 80 daily  Return in about 2 weeks (around 09/16/2018) for LROB.

## 2018-09-16 ENCOUNTER — Encounter: Payer: Self-pay | Admitting: Obstetrics and Gynecology

## 2018-09-16 ENCOUNTER — Ambulatory Visit (INDEPENDENT_AMBULATORY_CARE_PROVIDER_SITE_OTHER): Payer: Medicaid Other | Admitting: Obstetrics and Gynecology

## 2018-09-16 VITALS — BP 120/75 | HR 83 | Wt 224.0 lb

## 2018-09-16 DIAGNOSIS — Z9114 Patient's other noncompliance with medication regimen: Secondary | ICD-10-CM

## 2018-09-16 DIAGNOSIS — Z1389 Encounter for screening for other disorder: Secondary | ICD-10-CM

## 2018-09-16 DIAGNOSIS — Z3483 Encounter for supervision of other normal pregnancy, third trimester: Secondary | ICD-10-CM

## 2018-09-16 DIAGNOSIS — Z3A35 35 weeks gestation of pregnancy: Secondary | ICD-10-CM

## 2018-09-16 DIAGNOSIS — Z86711 Personal history of pulmonary embolism: Secondary | ICD-10-CM

## 2018-09-16 DIAGNOSIS — Z331 Pregnant state, incidental: Secondary | ICD-10-CM

## 2018-09-16 LAB — POCT URINALYSIS DIPSTICK OB
Blood, UA: NEGATIVE
Glucose, UA: NEGATIVE
KETONES UA: NEGATIVE
Leukocytes, UA: NEGATIVE
NITRITE UA: NEGATIVE
PROTEIN: NEGATIVE

## 2018-09-16 NOTE — Progress Notes (Addendum)
Patient ID: [name], female   DOB: [birth year], 27 y.o.   MRN: [id number]    LOW-RISK PREGNANCY VISIT Patient name: [name] MRN [id number]  Date of birth: Aug 26, 1991 Chief Complaint:   Routine Prenatal Visit  History of Present Illness:   [name] is a 27 y.o. [id number] female at [redacted]w[redacted]d with an Estimated Date of Delivery: 10/17/18 being seen today for ongoing management of a low-risk pregnancy. Hasn't had levonox shot in a week. Importance . Feels like she is in the beginning of the pregnancy, with fatigue but is in great spirits otherwise. Is unsure of [facility] plans but would like information on Implanon. Thought about getting Mirena again but is unsure. She carries sickle cell trait. Today she reports fatigue. Contractions: Irregular. Vag. Bleeding: None.  Movement: Present. denies leaking of fluid. Review of Systems:   Pertinent items are noted in HPI has taken Coumadin in the past, but was on no anticoagulant after 1 year from the date of the pulmonary embolus Denies abnormal vaginal discharge w/ itching/odor/irritation, headaches, visual changes, shortness of breath, chest pain, abdominal pain, severe nausea/vomiting, or problems with urination or bowel movements unless otherwise stated above. Pertinent History Reviewed:  Reviewed past medical,surgical, social, obstetrical and family history.  Reviewed problem list, medications and allergies. Physical Assessment:   Vitals:   09/16/18 0849  BP: 120/75  Pulse: 83  Weight: 224 lb (101.6 kg)  Body mass index is 36.15 kg/m.        Physical Examination:   General appearance: Well appearing, and in no distress  Mental status: Alert, oriented to person, place, and time  Skin: Warm & dry  Cardiovascular: Normal heart rate noted  Respiratory: Normal respiratory effort, no distress  Abdomen: Soft, gravid, nontender fundal height 35 cm cephalic presentation  Pelvic: Cervical exam deferred         Extremities: Edema:  None  Fetal Status: Fetal Heart Rate (bpm): 128 Fundal Height: 35 cm Movement: Present    Results for orders placed or performed in visit on 09/16/18 (from the past 24 hour(s))  POC Urinalysis Dipstick OB   Collection Time: 09/16/18  8:50 AM  Result Value Ref Range   Color, UA     Clarity, UA     Glucose, UA Negative Negative   Bilirubin, UA     Ketones, UA neg    Spec Grav, UA     Blood, UA neg    pH, UA     POC Protein UA Negative Negative, Trace   Urobilinogen, UA     Nitrite, UA neg    Leukocytes, UA Negative Negative   Appearance     Odor      Assessment & Plan:  1)  pregnancy notable for history of pulmonary embolus, on anticoagulant during pregnancy, poor compliance with anticoagulant Lovenox shots [name] at [redacted]w[redacted]d with an Estimated Date of Delivery: 10/17/18   Meds: No orders of the defined types were placed in this encounter.  Labs/procedures today: None  Plan:   Continue routine obstetrical care.  Importance emphasized of resuming Lovenox and being compliant.  Patient to set an alarm daily on Phone F/u 1 week for GBS,GC/CHL Patient aware she will have to continue Levonox shots 6 weeks postpartum IUD reccomendation  Follow-up: Return in about 1 week (around 09/23/2018) for LROB, gbs gcchl.  Orders Placed This Encounter  Procedures  . POC Urinalysis Dipstick OB   By signing my name below, I, Arnette Norris, attest that this documentation has been prepared  under the direction and in the presence of Tilda Burrow, MD. Electronically Signed: Arnette Norris Medical Scribe. 09/16/18. 9:08 AM.  I personally performed the services described in this documentation, which was SCRIBED in my presence. The recorded information has been reviewed and considered accurate. It has been edited as necessary during review. Tilda Burrow, MD

## 2018-09-23 ENCOUNTER — Telehealth (HOSPITAL_COMMUNITY): Payer: Self-pay | Admitting: *Deleted

## 2018-09-23 ENCOUNTER — Encounter (HOSPITAL_COMMUNITY): Payer: Self-pay | Admitting: *Deleted

## 2018-09-23 ENCOUNTER — Ambulatory Visit (INDEPENDENT_AMBULATORY_CARE_PROVIDER_SITE_OTHER): Payer: Medicaid Other | Admitting: Women's Health

## 2018-09-23 ENCOUNTER — Encounter: Payer: Self-pay | Admitting: Women's Health

## 2018-09-23 VITALS — BP 105/74 | HR 103 | Wt 227.0 lb

## 2018-09-23 DIAGNOSIS — Z1389 Encounter for screening for other disorder: Secondary | ICD-10-CM

## 2018-09-23 DIAGNOSIS — B009 Herpesviral infection, unspecified: Secondary | ICD-10-CM

## 2018-09-23 DIAGNOSIS — O321XX0 Maternal care for breech presentation, not applicable or unspecified: Secondary | ICD-10-CM

## 2018-09-23 DIAGNOSIS — O98512 Other viral diseases complicating pregnancy, second trimester: Secondary | ICD-10-CM

## 2018-09-23 DIAGNOSIS — Z331 Pregnant state, incidental: Secondary | ICD-10-CM

## 2018-09-23 DIAGNOSIS — Z3A36 36 weeks gestation of pregnancy: Secondary | ICD-10-CM

## 2018-09-23 DIAGNOSIS — Z3483 Encounter for supervision of other normal pregnancy, third trimester: Secondary | ICD-10-CM

## 2018-09-23 LAB — POCT URINALYSIS DIPSTICK OB
Blood, UA: NEGATIVE
Glucose, UA: NEGATIVE
KETONES UA: NEGATIVE
Leukocytes, UA: NEGATIVE
Nitrite, UA: NEGATIVE
POC,PROTEIN,UA: NEGATIVE

## 2018-09-23 MED ORDER — ACYCLOVIR 400 MG PO TABS: 400.0000 mg | ORAL_TABLET | Freq: Three times a day (TID) | ORAL | 3 refills | Status: DC

## 2018-09-23 NOTE — Progress Notes (Signed)
LOW-RISK PREGNANCY VISIT Patient name: [name] MRN [id number]  Date of birth: [birth year] Chief Complaint:   Routine Prenatal Visit (GBS, GC/CHL)  History of Present Illness:   [name] is a 27 y.o. [phone] female at [redacted]w[redacted]d with an Estimated Date of Delivery: 10/17/18 being seen today for ongoing management of a low-risk pregnancy. On Lovenox for h/o PE. Skips maybe 2 doses/wk d/t hard/sore places on belly Today she reports no complaints. Contractions: Irregular. Vag. Bleeding: None.  Movement: Present. denies leaking of fluid. Review of Systems:   Pertinent items are noted in HPI Denies abnormal vaginal discharge w/ itching/odor/irritation, headaches, visual changes, shortness of breath, chest pain, abdominal pain, severe nausea/vomiting, or problems with urination or bowel movements unless otherwise stated above. Pertinent History Reviewed:  Reviewed past medical,surgical, social, obstetrical and family history.  Reviewed problem list, medications and allergies. Physical Assessment:   Vitals:   09/23/18 0905  BP: 105/74  Pulse: (!) 103  Weight: 227 lb (103 kg)  Body mass index is 36.64 kg/m.        Physical Examination:   General appearance: Well appearing, and in no distress  Mental status: Alert, oriented to person, place, and time  Skin: Warm & dry  Cardiovascular: Normal heart rate noted  Respiratory: Normal respiratory effort, no distress  Abdomen: Soft, gravid, nontender  Pelvic: Cervical exam performed  Dilation: 3 Effacement (%): Thick Station: Ballotable  Extremities: Edema: None  Fetal Status: Fetal Heart Rate (bpm): 140 Fundal Height: 36 cm Movement: Present Presentation: Complete Breech, confirmed by u/s, head in upper Rt quad  Results for orders placed or performed in visit on 09/23/18 (from the past 24 hour(s))  POC Urinalysis Dipstick OB   Collection Time: 09/23/18  9:06 AM  Result Value Ref Range   Color, UA     Clarity, UA     Glucose, UA  Negative Negative   Bilirubin, UA     Ketones, UA neg    Spec Grav, UA     Blood, UA neg    pH, UA     POC Protein UA Negative Negative, Trace   Urobilinogen, UA     Nitrite, UA neg    Leukocytes, UA Negative Negative   Appearance     Odor      Assessment & Plan:  1) Low-risk pregnancy [id number] at [redacted]w[redacted]d with an Estimated Date of Delivery: 10/17/18   2) H/O PE, continue Lovenox 80mg  daily- skipping some injections d/t hard/sore spots, discussed importance of daily injections, alternating sites  3) Breech> head RUQ, discussed options C/S vs ECV, wants ECV, scheduled for 10/21 @ 0800, npo after MN. 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 acyclovir (ZOVIRAX) 400 MG tablet    Sig: Take 1 tablet (400 mg total) by mouth 3 (three) times daily.    Dispense:  90 tablet    Refill:  3    Order Specific Question:   Supervising Provider    Answer:   Lazaro Arms [[id number]]   Labs/procedures today: gbs, gc/ct, sve, informal u/s  Plan:  Continue routine obstetrical care   Reviewed: Preterm labor symptoms and general obstetric precautions including but not limited to vaginal bleeding, contractions, leaking of fluid and fetal movement were reviewed in detail with the patient.  All questions were answered  Follow-up: Return in about 1 week (around 09/30/2018) for LROB.  Orders Placed This  Encounter  Procedures  . GC/Chlamydia Probe Amp  . Strep Gp B NAA  . POC Urinalysis Dipstick OB   Cheral Marker CNM, [facility] 09/23/2018 9:40 AM

## 2018-09-23 NOTE — Telephone Encounter (Signed)
Preadmission screen  

## 2018-09-23 NOTE — Patient Instructions (Signed)
Monday 10/21 at 8:00am at [facility] hospital Nothing to eat or drink after midnight the night before  External cephalic version  [email or web address] Dive into deep end of pool Chiropractor- Webster technique Moxibustion for breech baby  [facility] [phone]) $120 new patients, do not file insurances  [facility] [phone])    [facility], I greatly value your feedback.  If you receive a survey following your visit with [place] today, we appreciate you taking the time to fill it out.  Thanks, [name], CNM, WHNP-BC   Call the office [phone]) or go to [facility] if:  You begin to have strong, frequent contractions  Your water breaks.  Sometimes it is a big gush of fluid, sometimes it is just a trickle that keeps getting your panties wet or running down your legs  You have vaginal bleeding.  It is normal to have a small amount of spotting if your cervix was checked.   You don't feel your baby moving like normal.  If you don't, get you something to eat and drink and lay down and focus on feeling your baby move.  You should feel at least 10 movements in 2 hours.  If you don't, you should call the office or go to [facility].    [facility] Contractions Contractions of the uterus can occur throughout pregnancy, but they are not always a sign that you are in labor. You may have practice contractions called Braxton Hicks contractions. These false labor contractions are sometimes confused with true labor. What are Deberah Pelton contractions? Braxton Hicks contractions are tightening movements that occur in the muscles of the uterus before labor. Unlike true labor contractions, these contractions do not result in opening (dilation) and thinning of the cervix. Toward the end of pregnancy (32-34 weeks), Braxton Hicks contractions can happen more often and may become stronger. These contractions are sometimes difficult to tell apart from true  labor because they can be very uncomfortable. You should not feel embarrassed if you go to the hospital with false labor. Sometimes, the only way to tell if you are in true labor is for your health care provider to look for changes in the cervix. The health care provider will do a physical exam and may monitor your contractions. If you are not in true labor, the exam should show that your cervix is not dilating and your water has not broken. If there are other health problems associated with your pregnancy, it is completely safe for you to be sent home with false labor. You may continue to have Braxton Hicks contractions until you go into true labor. How to tell the difference between true labor and false labor True labor  Contractions last 30-70 seconds.  Contractions become very regular.  Discomfort is usually felt in the top of the uterus, and it spreads to the lower abdomen and low back.  Contractions do not go away with walking.  Contractions usually become more intense and increase in frequency.  The cervix dilates and gets thinner. False labor  Contractions are usually shorter and not as strong as true labor contractions.  Contractions are usually irregular.  Contractions are often felt in the front of the lower abdomen and in the groin.  Contractions may go away when you walk around or change positions while lying down.  Contractions get weaker and are shorter-lasting as time goes on.  The cervix usually does not dilate or become thin. Follow these instructions at home:  Take over-the-counter and  prescription medicines only as told by your health care provider.  Keep up with your usual exercises and follow other instructions from your health care provider.  Eat and drink lightly if you think you are going into labor.  If Braxton Hicks contractions are making you uncomfortable: ? Change your position from lying down or resting to walking, or change from walking to  resting. ? Sit and rest in a tub of warm water. ? Drink enough fluid to keep your urine pale yellow. Dehydration may cause these contractions. ? Do slow and deep breathing several times an hour.  Keep all follow-up prenatal visits as told by your health care provider. This is important. Contact a health care provider if:  You have a fever.  You have continuous pain in your abdomen. Get help right away if:  Your contractions become stronger, more regular, and closer together.  You have fluid leaking or gushing from your vagina.  You pass blood-tinged mucus (bloody show).  You have bleeding from your vagina.  You have low back pain that you never had before.  You feel your baby's head pushing down and causing pelvic pressure.  Your baby is not moving inside you as much as it used to. Summary  Contractions that occur before labor are called Braxton Hicks contractions, false labor, or practice contractions.  Braxton Hicks contractions are usually shorter, weaker, farther apart, and less regular than true labor contractions. True labor contractions usually become progressively stronger and regular and they become more frequent.  Manage discomfort from [facility] contractions by changing position, resting in a warm bath, drinking plenty of water, or practicing deep breathing. This information is not intended to replace advice given to you by your health care provider. Make sure you discuss any questions you have with your health care provider. Document Released: 04/08/2017 Document Revised: 04/08/2017 Document Reviewed: 04/08/2017 Elsevier Interactive Patient Education  2018 [employer].

## 2018-09-25 LAB — STREP GP B NAA: Strep Gp B NAA: POSITIVE — AB

## 2018-09-26 ENCOUNTER — Ambulatory Visit (HOSPITAL_COMMUNITY)
Admission: RE | Admit: 2018-09-26 | Discharge: 2018-09-26 | Disposition: A | Discharge status: Home / Self Care | Payer: Medicaid Other | Source: Ambulatory Visit | Attending: Obstetrics and Gynecology | Admitting: Obstetrics and Gynecology

## 2018-09-26 ENCOUNTER — Encounter (HOSPITAL_COMMUNITY): Payer: Self-pay

## 2018-09-26 VITALS — BP 124/67 | HR 88 | Temp 98.1°F | Ht 66.0 in | Wt 227.0 lb

## 2018-09-26 DIAGNOSIS — Z3490 Encounter for supervision of normal pregnancy, unspecified, unspecified trimester: Secondary | ICD-10-CM

## 2018-09-26 DIAGNOSIS — D573 Sickle-cell trait: Secondary | ICD-10-CM | POA: Diagnosis not present

## 2018-09-26 DIAGNOSIS — Z86711 Personal history of pulmonary embolism: Secondary | ICD-10-CM | POA: Diagnosis present

## 2018-09-26 DIAGNOSIS — Z3A37 37 weeks gestation of pregnancy: Secondary | ICD-10-CM | POA: Insufficient documentation

## 2018-09-26 DIAGNOSIS — O320XX0 Maternal care for unstable lie, not applicable or unspecified: Secondary | ICD-10-CM | POA: Diagnosis not present

## 2018-09-26 DIAGNOSIS — O99013 Anemia complicating pregnancy, third trimester: Secondary | ICD-10-CM | POA: Diagnosis not present

## 2018-09-26 DIAGNOSIS — Z3483 Encounter for supervision of other normal pregnancy, third trimester: Secondary | ICD-10-CM

## 2018-09-26 DIAGNOSIS — Z79899 Other long term (current) drug therapy: Secondary | ICD-10-CM | POA: Diagnosis not present

## 2018-09-26 DIAGNOSIS — O329XX0 Maternal care for malpresentation of fetus, unspecified, not applicable or unspecified: Secondary | ICD-10-CM

## 2018-09-26 DIAGNOSIS — B009 Herpesviral infection, unspecified: Secondary | ICD-10-CM | POA: Diagnosis present

## 2018-09-26 MED ORDER — TERBUTALINE SULFATE 1 MG/ML IJ SOLN
0.2500 mg | Freq: Once | INTRAMUSCULAR | Status: AC
  Administered 2018-09-26: 0.25 mg via SUBCUTANEOUS
  Filled 2018-09-26: qty 1

## 2018-09-26 MED ORDER — LACTATED RINGERS IV SOLN
INTRAVENOUS | Status: DC
  Administered 2018-09-26: 09:00:00 via INTRAVENOUS

## 2018-09-26 NOTE — Discharge Summary (Signed)
Postpartum Discharge Summary     Patient Name: [name] DOB: [birth year] MRN: [id number]  Date of admission: 09/26/2018 Delivering Provider: This patient has no babies on file.  Date of discharge: 09/26/2018  Admitting diagnosis: Fetal Malpresentation (transverse Lie)   For  VERSION  Intrauterine pregnancy: [redacted]w[redacted]d     Secondary diagnosis:  Active Problems:   Pulmonary embolism (HCC)   HSV-2 (herpes simplex virus 2) infection   Sickle cell trait (HCC)   Supervision of normal pregnancy  Additional problems: Transverse lie of fetus     Discharge diagnosis: Single intrauterine pregnancy at [redacted]w[redacted]d                                        Vertex presentation of fetus                                        S/P External Version                                        Malpresentaton resolved                                                                                                Post partum procedures:n/a Complications: None  Hospital course:  Presented with fetal malpresentation (transverse) for elective version                                See Dr Earlene Plater' note for procedure                                Fetus successfully verted to vertex presentation                                Monitored post version and fetal heart rate pattern was reassuring.  Magnesium Sulfate recieved: No BMZ received: No  Physical exam  Vitals:   09/26/18 0832 09/26/18 0838 09/26/18 1102  BP: 118/72  124/67  Pulse: 97  88  Temp: 98.1 F (36.7 C)    TempSrc: Oral    Weight:  103 kg   Height:  5\' 6"  (1.676 m)    General: alert, cooperative and no distress Lochia: n/a Uterine Fundus: soft Incision: N/A DVT Evaluation: No evidence of DVT seen on physical exam. Labs: Lab Results  Component Value Date   WBC 10.2 08/11/2018   HGB 12.2 08/11/2018   HCT 38.6 08/11/2018   MCV 84 08/11/2018   PLT 250 08/11/2018   CMP Latest Ref Rng & Units 01/01/2014  Glucose 70 - 99 mg/dL 89  BUN 6 -  23 mg/dL 4(L)  Creatinine 1.19 -  1.10 mg/dL 4.09  Sodium 811 - 914 mEq/L 138  Potassium 3.7 - 5.3 mEq/L 3.9  Chloride 96 - 112 mEq/L 103  CO2 19 - 32 mEq/L 24  Calcium 8.4 - 10.5 mg/dL 9.1  Total Protein 6.0 - 8.3 g/dL 6.8  Total Bilirubin 0.3 - 1.2 mg/dL <7.8(G)  Alkaline Phos 39 - 117 U/L 40  AST 0 - 37 U/L 14  ALT 0 - 35 U/L 9    Discharge instruction: per After Visit Summary and "Baby and Me Booklet".  After visit meds:  PNV Lovenox Zovirax  Diet: routine diet  Activity: Advance as tolerated. Pelvic rest for 6 weeks.   Outpatient follow [id number] as scheduled Follow up Appt: Future Appointments  Date Time Provider Department Center  09/30/2018 12:45 PM Lazaro Arms, MD [facility]   Follow up Visit:  Clinic Friday for OB visit  09/26/2018 Wynelle Bourgeois, CNM

## 2018-09-26 NOTE — Plan of Care (Signed)
Pt states she drank some sparkling water this am at 0630. Dr. Clemens Catholic aware ok to proceed as long as it was clear liquids. FP aware.

## 2018-09-26 NOTE — Discharge Instructions (Signed)
Vaginal Delivery  Vaginal delivery means that you will give birth by pushing your baby out of your birth canal (vagina). A team of health care providers will help you before, during, and after vaginal delivery. Birth experiences are unique for every woman and every pregnancy, and birth experiences vary depending on where you choose to give birth.  What should I do to prepare for my baby's birth?  Before your baby is born, it is important to talk with your health care provider about:   Your labor and delivery preferences. These may include:  ? Medicines that you may be given.  ? How you will manage your pain. This might include non-medical pain relief techniques or injectable pain relief such as epidural analgesia.  ? How you and your baby will be monitored during labor and delivery.  ? Who may be in the labor and delivery room with you.  ? Your feelings about surgical delivery of your baby (cesarean delivery, or C-section) if this becomes necessary.  ? Your feelings about receiving donated blood through an IV tube (blood transfusion) if this becomes necessary.   Whether you are able:  ? To take pictures or videos of the birth.  ? To eat during labor and delivery.  ? To move around, walk, or change positions during labor and delivery.   What to expect after your baby is born, such as:  ? Whether delayed umbilical cord clamping and cutting is offered.  ? Who will care for your baby right after birth.  ? Medicines or tests that may be recommended for your baby.  ? Whether breastfeeding is supported in your hospital or birth center.  ? How long you will be in the hospital or birth center.   How any medical conditions you have may affect your baby or your labor and delivery experience.    To prepare for your baby's birth, you should also:   Attend all of your health care visits before delivery (prenatal visits) as recommended by your health care provider. This is important.   Prepare your home for your baby's  arrival. Make sure that you have:  ? Diapers.  ? Baby clothing.  ? Feeding equipment.  ? Safe sleeping arrangements for you and your baby.   Install a car seat in your vehicle. Have your car seat checked by a certified car seat installer to make sure that it is installed safely.   Think about who will help you with your new baby at home for at least the first several weeks after delivery.    What can I expect when I arrive at the birth center or hospital?  Once you are in labor and have been admitted into the hospital or birth center, your health care provider may:   Review your pregnancy history and any concerns you have.   Insert an IV tube into one of your veins. This is used to give you fluids and medicines.   Check your blood pressure, pulse, temperature, and heart rate (vital signs).   Check whether your bag of water (amniotic sac) has broken (ruptured).   Talk with you about your birth plan and discuss pain control options.    Monitoring  Your health care provider may monitor your contractions (uterine monitoring) and your baby's heart rate (fetal monitoring). You may need to be monitored:   Often, but not continuously (intermittently).   All the time or for long periods at a time (continuously). Continuous monitoring may be needed if:  ?   You are taking certain medicines, such as medicine to relieve pain or make your contractions stronger.  ? You have pregnancy or labor complications.    Monitoring may be done by:   Placing a special stethoscope or a handheld monitoring device on your abdomen to check your baby's heartbeat, and feeling your abdomen for contractions. This method of monitoring does not continuously record your baby's heartbeat or your contractions.   Placing monitors on your abdomen (external monitors) to record your baby's heartbeat and the frequency and length of contractions. You may not have to wear external monitors all the time.   Placing monitors inside of your uterus  (internal monitors) to record your baby's heartbeat and the frequency, length, and strength of your contractions.  ? Your health care provider may use internal monitors if he or she needs more information about the strength of your contractions or your baby's heart rate.  ? Internal monitors are put in place by passing a thin, flexible wire through your vagina and into your uterus. Depending on the type of monitor, it may remain in your uterus or on your baby's head until birth.  ? Your health care provider will discuss the benefits and risks of internal monitoring with you and will ask for your permission before inserting the monitors.   Telemetry. This is a type of continuous monitoring that can be done with external or internal monitors. Instead of having to stay in bed, you are able to move around during telemetry. Ask your health care provider if telemetry is an option for you.    Physical exam  Your health care provider may perform a physical exam. This may include:   Checking whether your baby is positioned:  ? With the head toward your vagina (head-down). This is most common.  ? With the head toward the top of your uterus (head-up or breech). If your baby is in a breech position, your health care provider may try to turn your baby to a head-down position so you can deliver vaginally. If it does not seem that your baby can be born vaginally, your provider may recommend surgery to deliver your baby. In rare cases, you may be able to deliver vaginally if your baby is head-up (breech delivery).  ? Lying sideways (transverse). Babies that are lying sideways cannot be delivered vaginally.   Checking your cervix to determine:  ? Whether it is thinning out (effacing).  ? Whether it is opening up (dilating).  ? How low your baby has moved into your birth canal.    What are the three stages of labor and delivery?    Normal labor and delivery is divided into the following three stages:  Stage 1   Stage 1 is the  longest stage of labor, and it can last for hours or days. Stage 1 includes:  ? Early labor. This is when contractions may be irregular, or regular and mild. Generally, early labor contractions are more than 10 minutes apart.  ? Active labor. This is when contractions get longer, more regular, more frequent, and more intense.  ? The transition phase. This is when contractions happen very close together, are very intense, and may last longer than during any other part of labor.   Contractions generally feel mild, infrequent, and irregular at first. They get stronger, more frequent (about every 2-3 minutes), and more regular as you progress from early labor through active labor and transition.   Many women progress through stage 1 naturally, but   you may need help to continue making progress. If this happens, your health care provider may talk with you about:  ? Rupturing your amniotic sac if it has not ruptured yet.  ? Giving you medicine to help make your contractions stronger and more frequent.   Stage 1 ends when your cervix is completely dilated to 4 inches (10 cm) and completely effaced. This happens at the end of the transition phase.  Stage 2   Once your cervix is completely effaced and dilated to 4 inches (10 cm), you may start to feel an urge to push. It is common for the body to naturally take a rest before feeling the urge to push, especially if you received an epidural or certain other pain medicines. This rest period may last for up to 1-2 hours, depending on your unique labor experience.   During stage 2, contractions are generally less painful, because pushing helps relieve contraction pain. Instead of contraction pain, you may feel stretching and burning pain, especially when the widest part of your baby's head passes through the vaginal opening (crowning).   Your health care provider will closely monitor your pushing progress and your baby's progress through the vagina during stage 2.   Your  health care provider may massage the area of skin between your vaginal opening and anus (perineum) or apply warm compresses to your perineum. This helps it stretch as the baby's head starts to crown, which can help prevent perineal tearing.  ? In some cases, an incision may be made in your perineum (episiotomy) to allow the baby to pass through the vaginal opening. An episiotomy helps to make the opening of the vagina larger to allow more room for the baby to fit through.   It is very important to breathe and focus so your health care provider can control the delivery of your baby's head. Your health care provider may have you decrease the intensity of your pushing, to help prevent perineal tearing.   After delivery of your baby's head, the shoulders and the rest of the body generally deliver very quickly and without difficulty.   Once your baby is delivered, the umbilical cord may be cut right away, or this may be delayed for 1-2 minutes, depending on your baby's health. This may vary among health care providers, hospitals, and birth centers.   If you and your baby are healthy enough, your baby may be placed on your chest or abdomen to help maintain the baby's temperature and to help you bond with each other. Some mothers and babies start breastfeeding at this time. Your health care team will dry your baby and help keep your baby warm during this time.   Your baby may need immediate care if he or she:  ? Showed signs of distress during labor.  ? Has a medical condition.  ? Was born too early (prematurely).  ? Had a bowel movement before birth (meconium).  ? Shows signs of difficulty transitioning from being inside the uterus to being outside of the uterus.  If you are planning to breastfeed, your health care team will help you begin a feeding.  Stage 3   The third stage of labor starts immediately after the birth of your baby and ends after you deliver the placenta. The placenta is an organ that develops  during pregnancy to provide oxygen and nutrients to your baby in the womb.   Delivering the placenta may require some pushing, and you may have mild contractions. Breastfeeding   you to breastfeed your baby. After labor is over, you and your baby will be monitored closely to ensure that you are both healthy until you are ready to go home. Your health care team will teach you how to care for yourself and your baby. This information is not intended to replace advice given to you by your health care provider. Make sure you discuss any questions you have with your health care provider. Document Released: 09/01/2008 Document Revised: 06/12/2016 Document Reviewed: 12/08/2015 Elsevier Interactive Patient Education  2018 [employer] Corporation[employer]xton Hicks Contractions Contractions of the uterus can occur throughout pregnancy, but they are not always a sign that you are in labor. You may have practice contractions called Braxton Hicks contractions. These false labor contractions are sometimes confused with true labor. What are Deberah PeltonBraxton Hicks contractions? Braxton Hicks contractions are tightening movements that occur in the muscles of the uterus before labor. Unlike true labor contractions, these contractions do not result in opening (dilation) and thinning of the cervix. Toward the end of pregnancy (32-34 weeks), Braxton Hicks contractions can happen  more often and may become stronger. These contractions are sometimes difficult to tell apart from true labor because they can be very uncomfortable. You should not feel embarrassed if you go to the hospital with false labor. Sometimes, the only way to tell if you are in true labor is for your health care provider to look for changes in the cervix. The health care provider will do a physical exam and may monitor your contractions. If you are not in true labor, the exam should show that your cervix is not dilating and your water has not broken. If there are other health problems associated with your pregnancy, it is completely safe for you to be sent home with false labor. You may continue to have Braxton Hicks contractions until you go into true labor. How to tell the difference between true labor and false labor True labor  Contractions last 30-70 seconds.  Contractions become very regular.  Discomfort is usually felt in the top of the uterus, and it spreads to the lower abdomen and low back.  Contractions do not go away with walking.  Contractions usually become more intense and increase in frequency.  The cervix dilates and gets thinner. False labor  Contractions are usually shorter and not as strong as true labor contractions.  Contractions are usually irregular.  Contractions are often felt in the front of the lower abdomen and in the groin.  Contractions may go away when you walk around or change positions while lying down.  Contractions get weaker and are shorter-lasting as time goes on.  The cervix usually does not dilate or become thin. Follow these instructions at home:  Take over-the-counter and prescription medicines only as told by your health care provider.  Keep up with your usual exercises and follow other instructions from your health care provider.  Eat and drink lightly if you think you are going into labor.  If Braxton Hicks contractions are making you  uncomfortable: ? Change your position from lying down or resting to walking, or change from walking to resting. ? Sit and rest in a tub of warm water. ? Drink enough fluid to keep your urine pale yellow. Dehydration may cause these contractions. ? Do slow and deep breathing several times an hour.  Keep all follow-up prenatal visits as told by your health care provider. This is important. Contact a health care provider if:  You have a fever.  You have continuous pain in your abdomen. Get help right away if:  Your contractions become stronger, more regular, and closer together.  You have fluid leaking or gushing from your vagina.  You pass blood-tinged mucus (bloody show).  You have bleeding from your vagina.  You have low back pain that you never had before.  You feel your baby's head pushing down and causing pelvic pressure.  Your baby is not moving inside you as much as it used to. Summary  Contractions that occur before labor are called Braxton Hicks contractions, false labor, or practice contractions.  Braxton Hicks contractions are usually shorter, weaker, farther apart, and less regular than true labor contractions. True labor contractions usually become progressively stronger and regular and they become more frequent.  Manage discomfort from Braxton Hicks contractions by changing position, resting in a warm bath, drinking plenty of water, or practicing deep breathing. This information is not intended to replace advice given to you by your health care provider. Make sure you discuss any questions you have with your health care provider. Document Released: 04/08/2017 Document Revised: 04/08/2017 Document Reviewed: 04/08/2017 Elsevier Interactive Patient Education  2018 [employer]  

## 2018-09-26 NOTE — H&P (Signed)
Obstetric History and Physical  [name] is a 27 y.o. [phone] with IUP at [redacted]w[redacted]d presenting for external cephalic version.   Prenatal Course  Pregnancy complications or risks: Patient Active Problem List   Diagnosis Date Noted  . Supervision of normal pregnancy 05/03/2018  . Sickle cell trait (HCC) 07/31/2016  . History of trichomoniasis 04/15/2016  . History of abnormal Pap smear 10/16/2013  . Dyspareunia 03/23/2013  . 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 Pulmonary embolism (HCC) 02/08/2013  . Family history of pulmonary embolism 02/08/2013   Medical History:  Past Medical History:  Diagnosis Date  . Abnormal Pap smear   . Chlamydia   . Contraceptive management 08/20/2014  . Family history of pulmonary embolism 02/08/2013   Father  . History of abnormal Pap smear 10/16/2013  . History of pulmonary embolus (PE) 08/20/2014  . History of trichomoniasis 04/15/2016  . 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 Hx of blood clots   . PE (pulmonary embolism) 02/08/2013   pulmonary embolus in May 2012 with infarction of the right lower lobe as well as pain in her lower legs presumably the site of origin.   . Supervision of high risk pregnancy in first trimester 10/16/2013   On lovenox 120 mg daily in 1 dose  . Vaginal Pap smear, abnormal     Past Surgical History:  Procedure Laterality Date  . INDUCED ABORTION  2012,2013  . INDUCED ABORTION  02/08/16    OB History  Gravida Para Term Preterm AB Living  6 2 2   3 2   SAB TAB Ectopic Multiple Live Births    3     2    # Outcome Date GA Lbr Len/2nd Weight Sex Delivery Anes PTL Lv  6 Current           5 TAB 2016          4 Term 06/12/14 [redacted]w[redacted]d -10:38 / 00:50 3399 g F Vag-Spont EPI N LIV     Birth Comments: caput  3 TAB 2013          2 TAB 2012          1 Term 11/13/09 [redacted]w[redacted]d  2863 g M Vag-Spont EPI N LIV    Social History   Socioeconomic History  . Marital status: Single    Spouse name: Not on file   . Number of children: Not on file  . Years of education: Not on file  . Highest education level: Not on file  Occupational History  . Not on file  Social Needs  . Financial resource strain: Not hard at all  . Food insecurity:    Worry: Never true    Inability: Never true  . Transportation needs:    Medical: No    Non-medical: Not on file  Tobacco Use  . Smoking status: Never Smoker  . Smokeless tobacco: Never Used  Substance and Sexual Activity  . Alcohol use: Not Currently    Comment: wine occ  . Drug use: No  . Sexual activity: Yes    Birth control/protection: None  Lifestyle  . Physical activity:    Days per week: Not on file    Minutes per session: Not on file  . Stress: Only a little  Relationships  . Social connections:    Talks on phone: Not on file    Gets together: Not on file    Attends religious service: Not on file  Active member of club or organization: Not on file    Attends meetings of clubs or organizations: Not on file    Relationship status: Not on file  Other Topics Concern  . Not on file  Social History Narrative  . Not on file    Family History  Problem Relation Age of Onset  . Diabetes Maternal Grandmother   . Hypertension Maternal Grandmother   . CAD Maternal Grandmother     Medications Prior to Admission  Medication Sig Dispense Refill Last Dose  . acyclovir (ZOVIRAX) 400 MG tablet Take 1 tablet (400 mg total) by mouth 3 (three) times daily. 90 tablet 3   . enoxaparin (LOVENOX) 80 MG/0.8ML injection Inject 0.8 mLs (80 mg total) into the skin daily. 30 Syringe 6 Taking  . Prenatal Vit-Fe Fumarate-FA (PRENATAL VITAMIN PO) Take by mouth.   Taking    No Known Allergies  Review of Systems: Negative except for what is mentioned in HPI.  Physical Exam: BP 118/72   Pulse 97   Temp 98.1 F (36.7 C) (Oral)   Ht 5\' 6"  (1.676 m)   Wt 103 kg   LMP  (LMP Unknown)   BMI 36.64 kg/m  CONSTITUTIONAL: Well-developed, well-nourished female  in no acute distress.  HENT:  Normocephalic, atraumatic, External right and left ear normal. Oropharynx is clear and moist EYES: Conjunctivae and EOM are normal. Pupils are equal, round, and reactive to light. No scleral icterus.  NECK: Normal range of motion, supple, no masses SKIN: Skin is warm and dry. No rash noted. Not diaphoretic. No erythema. No pallor. NEUROLOGIC: Alert and oriented to person, place, and time. Normal reflexes, muscle tone coordination. No cranial nerve deficit noted. PSYCHIATRIC: Normal mood and affect. Normal behavior. Normal judgment and thought content. CARDIOVASCULAR: Normal heart rate noted, regular rhythm RESPIRATORY: Effort and breath sounds normal, no problems with respiration noted ABDOMEN: Soft, nontender, nondistended, gravid. MUSCULOSKELETAL: Normal range of motion. No edema and no tenderness. 2+ distal pulses.    Bedside US: transverse, head to maternal right, fundal placenta  Pertinent Labs/Studies:   No results found for this or any previous visit (from the past 24 hour(s)).  Assessment : [name] is a 27 y.o. [id number] at [redacted]w[redacted]d being admitted for external cephalic version. Fetus is transverse today.  Reviewed risks of external cephalic version including risk of pain, bleeding, abruption, fetal intolerance, injury to fetus, failure for turn fetus. Reviewed risk of need for urgent primary c-section for fetal intolerance, including risks of c-section of infection, bleeding, damage to surrounding tissue and organs. Reviewed that if she remains breech, would be scheduled for primary c-section. She verbalizes understanding and desires to proceed, consent signed.  Terbutaline NPO   K. Therese Sarah, M.D. [facility] for [facility]  09/26/2018, 9:38 AM

## 2018-09-26 NOTE — Procedures (Signed)
Patient is a 27 yo [id number] @ [redacted]w[redacted]d who presents for external cephalic version for transverse presentation. I reviewed risks of external cephalic version including risk of pain, bleeding, abruption, fetal intolerance, injury to fetus, failure for turn fetus. Reviewed risk of need for urgent primary c-section for fetal intolerance, including risks of c-section of infection, bleeding, damage to surrounding tissue and organs. Reviewed that if she remains breech, would be scheduled for primary c-section. She verbalizes understanding and desires to proceed, consent signed.  Terbutaline given. Timeout performed.   The fetus was visualized on [place] to be transverse with head to maternal right. A forward roll toward maternal right was attempted and the fetus was gently turned towards the cephalic position. The fetal heart rate was checked several times during the procedure via ultrasound and found to be within normal range. Once the fetus was found to be cephalic, an arm was noted to be below the head, however no further attempts at turning infant were made. At this point, the procedure was finished and the patient was put back on continuous fetal monitoring.   The patient tolerated the procedure fairly well. She will be monitored for an hour and discharged to home with reassuring fetal tracing. Post version instructions were given.    Baldemar Lenis, M.D. [facility] for [employer]

## 2018-09-27 LAB — GC/CHLAMYDIA PROBE AMP
Chlamydia trachomatis, NAA: NEGATIVE
Neisseria gonorrhoeae by PCR: NEGATIVE

## 2018-09-30 ENCOUNTER — Encounter: Payer: Self-pay | Admitting: Obstetrics & Gynecology

## 2018-09-30 ENCOUNTER — Ambulatory Visit (INDEPENDENT_AMBULATORY_CARE_PROVIDER_SITE_OTHER): Payer: Medicaid Other | Admitting: Obstetrics & Gynecology

## 2018-09-30 ENCOUNTER — Other Ambulatory Visit: Payer: Self-pay

## 2018-09-30 VITALS — BP 123/82 | HR 91 | Wt 225.0 lb

## 2018-09-30 DIAGNOSIS — Z331 Pregnant state, incidental: Secondary | ICD-10-CM

## 2018-09-30 DIAGNOSIS — Z1389 Encounter for screening for other disorder: Secondary | ICD-10-CM

## 2018-09-30 DIAGNOSIS — Z3483 Encounter for supervision of other normal pregnancy, third trimester: Secondary | ICD-10-CM | POA: Diagnosis not present

## 2018-09-30 DIAGNOSIS — Z3A37 37 weeks gestation of pregnancy: Secondary | ICD-10-CM

## 2018-09-30 LAB — POCT URINALYSIS DIPSTICK OB
GLUCOSE, UA: NEGATIVE
Ketones, UA: NEGATIVE
LEUKOCYTES UA: NEGATIVE
Nitrite, UA: NEGATIVE
POC,PROTEIN,UA: NEGATIVE
RBC UA: NEGATIVE

## 2018-09-30 NOTE — Progress Notes (Signed)
   LOW-RISK PREGNANCY VISIT Patient name: [name] MRN [id number]  Date of birth: May 18, 1991 Chief Complaint:   High Risk Gestation  History of Present Illness:   [name] is a 27 y.o. [id number] female at [redacted]w[redacted]d with an Estimated Date of Delivery: 10/17/18 being seen today for ongoing management of a low-risk pregnancy.  Today she reports no complaints. Contractions: Irregular. Vag. Bleeding: None.  Movement: Present. denies leaking of fluid. Review of Systems:   Pertinent items are noted in HPI Denies abnormal vaginal discharge w/ itching/odor/irritation, headaches, visual changes, shortness of breath, chest pain, abdominal pain, severe nausea/vomiting, or problems with urination or bowel movements unless otherwise stated above. Pertinent History Reviewed:  Reviewed past medical,surgical, social, obstetrical and family history.  Reviewed problem list, medications and allergies. Physical Assessment:   Vitals:   09/30/18 1251  BP: 123/82  Pulse: 91  Weight: 225 lb (102.1 kg)  Body mass index is 36.32 kg/m.        Physical Examination:   General appearance: Well appearing, and in no distress  Mental status: Alert, oriented to person, place, and time  Skin: Warm & dry  Cardiovascular: Normal heart rate noted  Respiratory: Normal respiratory effort, no distress  Abdomen: Soft, gravid, nontender  Pelvic: Cervical exam deferred         Extremities: Edema: None  Fetal Status:     Movement: Present    Results for orders placed or performed in visit on 09/30/18 (from the past 24 hour(s))  POC Urinalysis Dipstick OB   Collection Time: 09/30/18 12:50 PM  Result Value Ref Range   Color, UA     Clarity, UA     Glucose, UA Negative Negative   Bilirubin, UA     Ketones, UA neg    Spec Grav, UA     Blood, UA neg    pH, UA     POC Protein UA Negative Negative, Trace   Urobilinogen, UA     Nitrite, UA neg    Leukocytes, UA Negative Negative   Appearance     Odor        Assessment & Plan:  1) Low-risk pregnancy [id number] at [redacted]w[redacted]d with an Estimated Date of Delivery: 10/17/18   2) Transverse, baby is transverse again today   Meds: No orders of the defined types were placed in this encounter.  Labs/procedures today:   Plan:  Continue routine obstetrical care plan for right now is to await labor and flip in labor ifpossible and use a belly band or depending, sometime between 39-40 weeks plan to come in do version and induce, plan to evolve   Reviewed: Term labor symptoms and general obstetric precautions including but not limited to vaginal bleeding, contractions, leaking of fluid and fetal movement were reviewed in detail with the patient.  All questions were answered  Follow-up: Return in about 1 week (around 10/07/2018) for LROB.  Orders Placed This Encounter  Procedures  . POC Urinalysis Dipstick OB   [name]  09/30/2018 1:02 PM

## 2018-10-02 ENCOUNTER — Other Ambulatory Visit: Payer: Self-pay | Admitting: Obstetrics & Gynecology

## 2018-10-02 ENCOUNTER — Inpatient Hospital Stay (HOSPITAL_COMMUNITY): Payer: Medicaid Other | Admitting: Anesthesiology

## 2018-10-02 ENCOUNTER — Encounter (HOSPITAL_COMMUNITY): Payer: Self-pay | Admitting: *Deleted

## 2018-10-02 ENCOUNTER — Other Ambulatory Visit: Payer: Self-pay

## 2018-10-02 ENCOUNTER — Inpatient Hospital Stay (HOSPITAL_COMMUNITY)
Admission: AD | Admit: 2018-10-02 | Discharge: 2018-10-04 | DRG: 787 | Disposition: A | Discharge status: Home / Self Care | Payer: Medicaid Other | Attending: Obstetrics & Gynecology | Admitting: Obstetrics & Gynecology

## 2018-10-02 ENCOUNTER — Encounter (HOSPITAL_COMMUNITY)
Admission: AD | Disposition: A | Discharge status: Home / Self Care | Payer: Self-pay | Source: Home / Self Care | Attending: Obstetrics & Gynecology

## 2018-10-02 DIAGNOSIS — Z7901 Long term (current) use of anticoagulants: Secondary | ICD-10-CM | POA: Diagnosis not present

## 2018-10-02 DIAGNOSIS — O99824 Streptococcus B carrier state complicating childbirth: Secondary | ICD-10-CM | POA: Diagnosis present

## 2018-10-02 DIAGNOSIS — O328XX0 Maternal care for other malpresentation of fetus, not applicable or unspecified: Secondary | ICD-10-CM

## 2018-10-02 DIAGNOSIS — D573 Sickle-cell trait: Secondary | ICD-10-CM | POA: Diagnosis present

## 2018-10-02 DIAGNOSIS — Z3490 Encounter for supervision of normal pregnancy, unspecified, unspecified trimester: Secondary | ICD-10-CM

## 2018-10-02 DIAGNOSIS — O9832 Other infections with a predominantly sexual mode of transmission complicating childbirth: Secondary | ICD-10-CM | POA: Diagnosis present

## 2018-10-02 DIAGNOSIS — O9902 Anemia complicating childbirth: Secondary | ICD-10-CM | POA: Diagnosis present

## 2018-10-02 DIAGNOSIS — Z86711 Personal history of pulmonary embolism: Secondary | ICD-10-CM | POA: Diagnosis present

## 2018-10-02 DIAGNOSIS — B009 Herpesviral infection, unspecified: Secondary | ICD-10-CM | POA: Diagnosis present

## 2018-10-02 DIAGNOSIS — Z98891 History of uterine scar from previous surgery: Secondary | ICD-10-CM

## 2018-10-02 DIAGNOSIS — A6000 Herpesviral infection of urogenital system, unspecified: Secondary | ICD-10-CM | POA: Diagnosis present

## 2018-10-02 DIAGNOSIS — Z3A37 37 weeks gestation of pregnancy: Secondary | ICD-10-CM

## 2018-10-02 DIAGNOSIS — Z8249 Family history of ischemic heart disease and other diseases of the circulatory system: Secondary | ICD-10-CM

## 2018-10-02 LAB — CBC
HEMATOCRIT: 37.6 % (ref 36.0–46.0)
HEMOGLOBIN: 13.4 g/dL (ref 12.0–15.0)
MCH: 27.4 pg (ref 26.0–34.0)
MCHC: 35.6 g/dL (ref 30.0–36.0)
MCV: 76.9 fL — AB (ref 80.0–100.0)
NRBC: 0 % (ref 0.0–0.2)
Platelets: 260 10*3/uL (ref 150–400)
RBC: 4.89 MIL/uL (ref 3.87–5.11)
RDW: 17.2 % — ABNORMAL HIGH (ref 11.5–15.5)
WBC: 12 10*3/uL — ABNORMAL HIGH (ref 4.0–10.5)

## 2018-10-02 LAB — TYPE AND SCREEN
ABO/RH(D): A POS
Antibody Screen: NEGATIVE

## 2018-10-02 SURGERY — Surgical Case
Anesthesia: General | Site: Abdomen | Wound class: Clean Contaminated

## 2018-10-02 MED ORDER — FAMOTIDINE IN NACL 20-0.9 MG/50ML-% IV SOLN
20.0000 mg | Freq: Once | INTRAVENOUS | Status: AC
  Administered 2018-10-02: 20 mg via INTRAVENOUS

## 2018-10-02 MED ORDER — PROMETHAZINE HCL 25 MG/ML IJ SOLN: 6.2500 mg | INTRAMUSCULAR | Status: DC | PRN

## 2018-10-02 MED ORDER — METHYLERGONOVINE MALEATE 0.2 MG/ML IJ SOLN: 0.2000 mg | INTRAMUSCULAR | Status: DC | PRN

## 2018-10-02 MED ORDER — SIMETHICONE 80 MG PO CHEW: 80.0000 mg | CHEWABLE_TABLET | ORAL | Status: DC | PRN

## 2018-10-02 MED ORDER — ONDANSETRON HCL 4 MG/2ML IJ SOLN
INTRAMUSCULAR | Status: AC
  Filled 2018-10-02: qty 2

## 2018-10-02 MED ORDER — HYDROMORPHONE HCL 1 MG/ML IJ SOLN
INTRAMUSCULAR | Status: AC
  Filled 2018-10-02: qty 0.5

## 2018-10-02 MED ORDER — COCONUT OIL OIL: 1.0000 "application " | TOPICAL_OIL | Status: DC | PRN

## 2018-10-02 MED ORDER — PROPOFOL 10 MG/ML IV BOLUS
INTRAVENOUS | Status: DC | PRN
  Administered 2018-10-02: 200 mg via INTRAVENOUS

## 2018-10-02 MED ORDER — HYDROMORPHONE HCL 1 MG/ML IJ SOLN
0.2500 mg | INTRAMUSCULAR | Status: DC | PRN
  Administered 2018-10-02: 0.5 mg via INTRAVENOUS

## 2018-10-02 MED ORDER — SODIUM CHLORIDE 0.9 % IR SOLN
Status: DC | PRN
  Administered 2018-10-02: 1000 mL

## 2018-10-02 MED ORDER — METHYLERGONOVINE MALEATE 0.2 MG PO TABS: 0.2000 mg | ORAL_TABLET | ORAL | Status: DC | PRN

## 2018-10-02 MED ORDER — DEXAMETHASONE SODIUM PHOSPHATE 10 MG/ML IJ SOLN
INTRAMUSCULAR | Status: AC
  Filled 2018-10-02: qty 1

## 2018-10-02 MED ORDER — PIPERACILLIN-TAZOBACTAM 3.375 G IVPB
3.3750 g | Freq: Three times a day (TID) | INTRAVENOUS | Status: AC
  Administered 2018-10-02 – 2018-10-03 (×3): 3.375 g via INTRAVENOUS
  Filled 2018-10-02 (×3): qty 50

## 2018-10-02 MED ORDER — OXYCODONE HCL 5 MG PO TABS: 5.0000 mg | ORAL_TABLET | Freq: Once | ORAL | Status: DC | PRN

## 2018-10-02 MED ORDER — ACETAMINOPHEN 325 MG PO TABS
650.0000 mg | ORAL_TABLET | ORAL | Status: DC | PRN
  Administered 2018-10-02 – 2018-10-03 (×2): 650 mg via ORAL
  Filled 2018-10-02 (×2): qty 2

## 2018-10-02 MED ORDER — MENTHOL 3 MG MT LOZG: 1.0000 | LOZENGE | OROMUCOSAL | Status: DC | PRN

## 2018-10-02 MED ORDER — TERBUTALINE SULFATE 1 MG/ML IJ SOLN
INTRAMUSCULAR | Status: AC
  Administered 2018-10-02: 0.25 mg
  Filled 2018-10-02: qty 1

## 2018-10-02 MED ORDER — CEFAZOLIN SODIUM-DEXTROSE 2-4 GM/100ML-% IV SOLN
2.0000 g | INTRAVENOUS | Status: AC
  Administered 2018-10-02: 2 g via INTRAVENOUS
  Filled 2018-10-02: qty 100

## 2018-10-02 MED ORDER — LACTATED RINGERS IV BOLUS: 1000.0000 mL | Freq: Once | INTRAVENOUS | Status: DC

## 2018-10-02 MED ORDER — ONDANSETRON HCL 4 MG/2ML IJ SOLN
INTRAMUSCULAR | Status: DC | PRN
  Administered 2018-10-02: 4 mg via INTRAVENOUS

## 2018-10-02 MED ORDER — ZOLPIDEM TARTRATE 5 MG PO TABS: 5.0000 mg | ORAL_TABLET | Freq: Every evening | ORAL | Status: DC | PRN

## 2018-10-02 MED ORDER — DIBUCAINE 1 % RE OINT: 1.0000 "application " | TOPICAL_OINTMENT | RECTAL | Status: DC | PRN

## 2018-10-02 MED ORDER — SUCCINYLCHOLINE CHLORIDE 20 MG/ML IJ SOLN
INTRAMUSCULAR | Status: DC | PRN
  Administered 2018-10-02: 180 mg via INTRAVENOUS

## 2018-10-02 MED ORDER — IBUPROFEN 600 MG PO TABS
600.0000 mg | ORAL_TABLET | Freq: Four times a day (QID) | ORAL | Status: DC
  Administered 2018-10-02 – 2018-10-04 (×10): 600 mg via ORAL
  Filled 2018-10-02 (×10): qty 1

## 2018-10-02 MED ORDER — FENTANYL CITRATE (PF) 100 MCG/2ML IJ SOLN
INTRAMUSCULAR | Status: DC | PRN
  Administered 2018-10-02 (×2): 50 ug via INTRAVENOUS
  Administered 2018-10-02: 250 ug via INTRAVENOUS
  Administered 2018-10-02: 100 ug via INTRAVENOUS
  Administered 2018-10-02: 50 ug via INTRAVENOUS

## 2018-10-02 MED ORDER — OXYTOCIN 40 UNITS IN LACTATED RINGERS INFUSION - SIMPLE MED: 2.5000 [IU]/h | INTRAVENOUS | Status: AC

## 2018-10-02 MED ORDER — OXYTOCIN 10 UNIT/ML IJ SOLN
INTRAMUSCULAR | Status: AC
  Filled 2018-10-02: qty 4

## 2018-10-02 MED ORDER — SOD CITRATE-CITRIC ACID 500-334 MG/5ML PO SOLN: 30.0000 mL | Freq: Once | ORAL | Status: DC

## 2018-10-02 MED ORDER — PRENATAL MULTIVITAMIN CH
1.0000 | ORAL_TABLET | Freq: Every day | ORAL | Status: DC
  Administered 2018-10-02 – 2018-10-04 (×3): 1 via ORAL
  Filled 2018-10-02 (×3): qty 1

## 2018-10-02 MED ORDER — FENTANYL CITRATE (PF) 100 MCG/2ML IJ SOLN: 100.0000 ug | Freq: Once | INTRAMUSCULAR | Status: DC

## 2018-10-02 MED ORDER — OXYCODONE HCL 5 MG/5ML PO SOLN: 5.0000 mg | Freq: Once | ORAL | Status: DC | PRN

## 2018-10-02 MED ORDER — TETANUS-DIPHTH-ACELL PERTUSSIS 5-2.5-18.5 LF-MCG/0.5 IM SUSP: 0.5000 mL | Freq: Once | INTRAMUSCULAR | Status: DC

## 2018-10-02 MED ORDER — LACTATED RINGERS IV SOLN: INTRAVENOUS | Status: DC

## 2018-10-02 MED ORDER — LACTATED RINGERS IV SOLN
INTRAVENOUS | Status: DC
  Administered 2018-10-03: 06:00:00 via INTRAVENOUS

## 2018-10-02 MED ORDER — SIMETHICONE 80 MG PO CHEW
80.0000 mg | CHEWABLE_TABLET | ORAL | Status: DC
  Administered 2018-10-03: 80 mg via ORAL
  Filled 2018-10-02 (×2): qty 1

## 2018-10-02 MED ORDER — ENOXAPARIN SODIUM 40 MG/0.4ML ~~LOC~~ SOLN: 40.0000 mg | SUBCUTANEOUS | Status: DC

## 2018-10-02 MED ORDER — OXYCODONE HCL 5 MG PO TABS
5.0000 mg | ORAL_TABLET | ORAL | Status: DC | PRN
  Administered 2018-10-02 – 2018-10-03 (×3): 5 mg via ORAL
  Filled 2018-10-02 (×3): qty 1

## 2018-10-02 MED ORDER — FENTANYL CITRATE (PF) 100 MCG/2ML IJ SOLN: 50.0000 ug | INTRAMUSCULAR | Status: DC | PRN

## 2018-10-02 MED ORDER — SOD CITRATE-CITRIC ACID 500-334 MG/5ML PO SOLN
ORAL | Status: AC
  Filled 2018-10-02: qty 15

## 2018-10-02 MED ORDER — FENTANYL CITRATE (PF) 100 MCG/2ML IJ SOLN
INTRAMUSCULAR | Status: AC
  Administered 2018-10-02: 100 ug
  Filled 2018-10-02: qty 2

## 2018-10-02 MED ORDER — LACTATED RINGERS IV SOLN
INTRAVENOUS | Status: DC | PRN
  Administered 2018-10-02 (×2): via INTRAVENOUS

## 2018-10-02 MED ORDER — PROPOFOL 10 MG/ML IV BOLUS
INTRAVENOUS | Status: AC
  Filled 2018-10-02: qty 20

## 2018-10-02 MED ORDER — DEXAMETHASONE SODIUM PHOSPHATE 10 MG/ML IJ SOLN
INTRAMUSCULAR | Status: DC | PRN
  Administered 2018-10-02: 10 mg via INTRAVENOUS

## 2018-10-02 MED ORDER — MEPERIDINE HCL 25 MG/ML IJ SOLN: 6.2500 mg | INTRAMUSCULAR | Status: DC | PRN

## 2018-10-02 MED ORDER — WITCH HAZEL-GLYCERIN EX PADS: 1.0000 "application " | MEDICATED_PAD | CUTANEOUS | Status: DC | PRN

## 2018-10-02 MED ORDER — OXYTOCIN 40 UNITS IN LACTATED RINGERS INFUSION - SIMPLE MED
INTRAVENOUS | Status: DC | PRN
  Administered 2018-10-02: 40 mL via INTRAVENOUS

## 2018-10-02 MED ORDER — FENTANYL CITRATE (PF) 250 MCG/5ML IJ SOLN
INTRAMUSCULAR | Status: AC
  Filled 2018-10-02: qty 5

## 2018-10-02 MED ORDER — DIPHENHYDRAMINE HCL 25 MG PO CAPS: 25.0000 mg | ORAL_CAPSULE | Freq: Four times a day (QID) | ORAL | Status: DC | PRN

## 2018-10-02 MED ORDER — CEFAZOLIN SODIUM-DEXTROSE 2-4 GM/100ML-% IV SOLN
INTRAVENOUS | Status: AC
  Filled 2018-10-02: qty 100

## 2018-10-02 MED ORDER — FAMOTIDINE IN NACL 20-0.9 MG/50ML-% IV SOLN
INTRAVENOUS | Status: AC
  Filled 2018-10-02: qty 50

## 2018-10-02 MED ORDER — SIMETHICONE 80 MG PO CHEW
80.0000 mg | CHEWABLE_TABLET | Freq: Three times a day (TID) | ORAL | Status: DC
  Administered 2018-10-02 – 2018-10-04 (×7): 80 mg via ORAL
  Filled 2018-10-02 (×6): qty 1

## 2018-10-02 MED ORDER — KETOROLAC TROMETHAMINE 30 MG/ML IJ SOLN: 30.0000 mg | Freq: Three times a day (TID) | INTRAMUSCULAR | Status: AC

## 2018-10-02 MED ORDER — SENNOSIDES-DOCUSATE SODIUM 8.6-50 MG PO TABS
2.0000 | ORAL_TABLET | ORAL | Status: DC
  Administered 2018-10-02 – 2018-10-03 (×2): 2 via ORAL
  Filled 2018-10-02 (×2): qty 2

## 2018-10-02 MED ORDER — LACTATED RINGERS IV SOLN
INTRAVENOUS | Status: DC | PRN
  Administered 2018-10-02: 07:00:00 via INTRAVENOUS

## 2018-10-02 SURGICAL SUPPLY — 37 items
CHLORAPREP W/TINT 26ML (MISCELLANEOUS) ×3 IMPLANT
CLAMP CORD UMBIL (MISCELLANEOUS) IMPLANT
CLOTH BEACON ORANGE TIMEOUT ST (SAFETY) ×3 IMPLANT
DERMABOND ADVANCED (GAUZE/BANDAGES/DRESSINGS) ×4
DERMABOND ADVANCED .7 DNX12 (GAUZE/BANDAGES/DRESSINGS) ×2 IMPLANT
DRSG OPSITE POSTOP 4X10 (GAUZE/BANDAGES/DRESSINGS) ×3 IMPLANT
ELECT REM PT RETURN 9FT ADLT (ELECTROSURGICAL) ×3
ELECTRODE REM PT RTRN 9FT ADLT (ELECTROSURGICAL) ×1 IMPLANT
EXTRACTOR VACUUM BELL STYLE (SUCTIONS) IMPLANT
GLOVE BIOGEL PI IND STRL 7.0 (GLOVE) ×1 IMPLANT
GLOVE BIOGEL PI IND STRL 8 (GLOVE) ×1 IMPLANT
GLOVE BIOGEL PI INDICATOR 7.0 (GLOVE) ×2
GLOVE BIOGEL PI INDICATOR 8 (GLOVE) ×2
GLOVE ECLIPSE 8.0 STRL XLNG CF (GLOVE) ×3 IMPLANT
GOWN STRL REUS W/TWL LRG LVL3 (GOWN DISPOSABLE) ×6 IMPLANT
KIT ABG SYR 3ML LUER SLIP (SYRINGE) ×3 IMPLANT
NEEDLE HYPO 18GX1.5 BLUNT FILL (NEEDLE) ×3 IMPLANT
NEEDLE HYPO 22GX1.5 SAFETY (NEEDLE) ×3 IMPLANT
NEEDLE HYPO 25X5/8 SAFETYGLIDE (NEEDLE) ×3 IMPLANT
NS IRRIG 1000ML POUR BTL (IV SOLUTION) ×3 IMPLANT
PACK C SECTION WH (CUSTOM PROCEDURE TRAY) ×3 IMPLANT
PAD OB MATERNITY 4.3X12.25 (PERSONAL CARE ITEMS) ×3 IMPLANT
PENCIL SMOKE EVAC W/HOLSTER (ELECTROSURGICAL) ×3 IMPLANT
RTRCTR C-SECT PINK 25CM LRG (MISCELLANEOUS) IMPLANT
SPONGE LAP 18X18 RF (DISPOSABLE) ×9 IMPLANT
SUT CHROMIC 0 CT 1 (SUTURE) ×3 IMPLANT
SUT MNCRL 0 VIOLET CTX 36 (SUTURE) ×2 IMPLANT
SUT MONOCRYL 0 CTX 36 (SUTURE) ×4
SUT PLAIN 2 0 (SUTURE)
SUT PLAIN 2 0 XLH (SUTURE) IMPLANT
SUT PLAIN ABS 2-0 CT1 27XMFL (SUTURE) IMPLANT
SUT VIC AB 0 CTX 36 (SUTURE) ×2
SUT VIC AB 0 CTX36XBRD ANBCTRL (SUTURE) ×1 IMPLANT
SUT VIC AB 4-0 KS 27 (SUTURE) IMPLANT
SYR 20CC LL (SYRINGE) ×6 IMPLANT
TOWEL OR 17X24 6PK STRL BLUE (TOWEL DISPOSABLE) ×3 IMPLANT
TRAY FOLEY W/BAG SLVR 14FR LF (SET/KITS/TRAYS/PACK) IMPLANT

## 2018-10-02 NOTE — H&P (Signed)
Obstetric Preoperative History and Physical  Bianco Cange is a 27 y.o. [phone] with IUP at [redacted]w[redacted]d presenting for active labor and after SROM. Found to be breech in MAU. Was being pre[pared for cesarean section and was given one dose of terbutaline then she was involuntarily pushing., Exam revelaed one foot at perineum and a prolapsed cord. Code Cesarean was activated and OR team and Anesthesia team notified.  No acute preoperative concerns.  s  Cesarean Section Indication: Prolapsed cord  Prenatal Course Source of Care: [facility] Pregnancy complications or risks: Patient Active Problem List   Diagnosis Date Noted  . Breech presentation, single footling 10/02/2018  . Umbilical cord, prolapsed 10/02/2018  . Supervision of normal pregnancy 05/03/2018  . Sickle cell trait (HCC) 07/31/2016  . History of trichomoniasis 04/15/2016  . History of abnormal Pap smear 10/16/2013  . Dyspareunia 03/23/2013  . 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 History of pulmonary embolus (PE) 02/08/2013  . Family history of pulmonary embolism 02/08/2013   Nursing Staff Provider  Office Location [facility]  Dating  1st trimester U/S 10wk    Anatomy US Normal female  Initiated care at 16wk  LAB RESULTS   Language  English Pap 02/26/16 neg  Support Person  Archie Millirons GC/CT Initial:  -/-        37wks:  -/-    Genetics NT/IT:too late     AFP: neg       NIPS:  Flu Vaccine  Declined 08/10/18  [place]/HgbE 07/29/16 Pos  TDaP vaccine  08/10/18  CF 10/16/13 neg  Rhogam   SMA Not done    Blood Type  A+  Feeding Plan breast Antibody  neg  Contraception Mirena Rubella  imm  Circumcision n/a RPR  neg  Pediatrician  declined HBsAg  neg  Prenatal Classes  HIV  neg    A1C/GTT Early:          26-28wks: 85/157/104    GBS   Pos     Past Medical History:  Diagnosis Date  . Abnormal Pap smear   . Chlamydia   . Contraceptive management 08/20/2014  . Family history of pulmonary embolism 02/08/2013   Father  .  History of abnormal Pap smear 10/16/2013  . History of pulmonary embolus (PE) 08/20/2014  . History of trichomoniasis 04/15/2016  . 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 Hx of blood clots   . PE (pulmonary embolism) 02/08/2013   pulmonary embolus in May 2012 with infarction of the right lower lobe as well as pain in her lower legs presumably the site of origin.   . Supervision of high risk pregnancy in first trimester 10/16/2013   On lovenox 120 mg daily in 1 dose  . Vaginal Pap smear, abnormal     Past Surgical History:  Procedure Laterality Date  . INDUCED ABORTION  2012,2013  . INDUCED ABORTION  02/08/16    OB History  Gravida Para Term Preterm AB Living  6 2 2   3 2   SAB TAB Ectopic Multiple Live Births    3     2    # Outcome Date GA Lbr Len/2nd Weight Sex Delivery Anes PTL Lv  6 Current           5 TAB 2016          4 Term 06/12/14 [redacted]w[redacted]d -10:38 / 00:50 3399 g F Vag-Spont EPI N LIV     Birth Comments: caput  3 TAB  2013          2 TAB 2012          1 Term 11/13/09 [redacted]w[redacted]d  2863 g M Vag-Spont EPI N LIV    Social History   Socioeconomic History  . Marital status: Single    Spouse name: Not on file  . Number of children: Not on file  . Years of education: Not on file  . Highest education level: Not on file  Occupational History  . Not on file  Social Needs  . Financial resource strain: Not hard at all  . Food insecurity:    Worry: Never true    Inability: Never true  . Transportation needs:    Medical: No    Non-medical: Not on file  Tobacco Use  . Smoking status: Never Smoker  . Smokeless tobacco: Never Used  Substance and Sexual Activity  . Alcohol use: Not Currently    Comment: wine occ  . Drug use: No  . Sexual activity: Yes    Birth control/protection: None  Lifestyle  . Physical activity:    Days per week: Not on file    Minutes per session: Not on file  . Stress: Only a little  Relationships  . Social connections:    Talks on phone: Not on  file    Gets together: Not on file    Attends religious service: Not on file    Active member of club or organization: Not on file    Attends meetings of clubs or organizations: Not on file    Relationship status: Not on file  Other Topics Concern  . Not on file  Social History Narrative  . Not on file    Family History  Problem Relation Age of Onset  . Diabetes Maternal Grandmother   . Hypertension Maternal Grandmother   . CAD Maternal Grandmother     Medications Prior to Admission  Medication Sig Dispense Refill Last Dose  . acyclovir (ZOVIRAX) 400 MG tablet Take 1 tablet (400 mg total) by mouth 3 (three) times daily. 90 tablet 3 Taking  . enoxaparin (LOVENOX) 80 MG/0.8ML injection Inject 0.8 mLs (80 mg total) into the skin daily. 30 Syringe 6 Taking  . Prenatal Vit-Fe Fumarate-FA (PRENATAL VITAMIN PO) Take by mouth.   Taking    No Known Allergies  Review of Systems: Pertinent items noted in HPI and remainder of comprehensive ROS otherwise negative.  Physical Exam: LMP  (LMP Unknown)  FHR by Doppler: 130-140s bpm CONSTITUTIONAL: Well-developed, well-nourished female in no acute distress.  HENT:  Normocephalic, atraumatic, External right and left ear normal. Oropharynx is clear and moist EYES: Conjunctivae and EOM are normal. Pupils are equal, round, and reactive to light. No scleral icterus.  NECK: Normal range of motion, supple, no masses SKIN: Skin is warm and dry. No rash noted. Not diaphoretic. No erythema. No pallor. NEUROLGIC: Alert and oriented to person, place, and time. Normal reflexes, muscle tone coordination. No cranial nerve deficit noted. PSYCHIATRIC: Normal mood and affect. Normal behavior. Normal judgment and thought content. CARDIOVASCULAR: Normal heart rate noted, regular rhythm RESPIRATORY: Effort and breath sounds normal, no problems with respiration noted ABDOMEN: Soft, nontender, nondistended, gravid.  PELVIC: Deferred MUSCULOSKELETAL: Normal range  of motion. No edema and no tenderness. 2+ distal pulses.  Ultrasound confirmed breech presentation   Pertinent Labs/Studies:   Results for orders placed or performed in visit on 09/30/18 (from the past 72 hour(s))  POC Urinalysis Dipstick OB  Status: None   Collection Time: 09/30/18 12:50 PM  Result Value Ref Range   Color, UA     Clarity, UA     Glucose, UA Negative Negative   Bilirubin, UA     Ketones, UA neg    Spec Grav, UA     Blood, UA neg    pH, UA     POC Protein UA Negative Negative, Trace   Urobilinogen, UA     Nitrite, UA neg    Leukocytes, UA Negative Negative   Appearance     Odor      Assessment and Plan :Torsha Lemus is a 27 y.o. [id number] at [redacted]w[redacted]d undergoing urgent cesarean section for cord prolpase. The risks of cesarean section were discussed with the patient included but were not limited to: bleeding which may require transfusion or reoperation; infection which may require antibiotics; injury to bowel, bladder, ureters or other surrounding organs; injury to the fetus; need for additional procedures including hysterectomy in the event of a life-threatening hemorrhage; placental abnormalities wth subsequent pregnancies, incisional problems, thromboembolic phenomenon and other postoperative/anesthesia complications. The patient concurred with the proposed plan, giving informed written consent for the procedure. Patient has been NPO since last night she will remain NPO for procedure. Anesthesia and OR aware. Preoperative prophylactic antibiotics and SCDs ordered on call to the OR. To OR when ready.    Jaynie Collins, MD, FACOG Obstetrician & Gynecologist, [facility] for [employer], [facility]

## 2018-10-02 NOTE — Anesthesia Postprocedure Evaluation (Signed)
Anesthesia Post Note  Patient: [name]  Procedure(s) Performed: CESAREAN SECTION (N/A Abdomen)     Patient location during evaluation: PACU Anesthesia Type: General Level of consciousness: awake and alert Pain management: pain level controlled Vital Signs Assessment: post-procedure vital signs reviewed and stable Respiratory status: spontaneous breathing, nonlabored ventilation and respiratory function stable Cardiovascular status: blood pressure returned to baseline and stable Postop Assessment: no apparent nausea or vomiting Anesthetic complications: no    Last Vitals:  Vitals:   10/02/18 0903 10/02/18 0915  BP: (!) 124/109 (!) 146/90  Pulse: 98 (!) 105  Resp: (!) 24 (!) 28  Temp:    SpO2: 95% 96%    Last Pain:  Vitals:   10/02/18 0915  TempSrc:   PainSc: 3    Pain Goal:                 Lowella Curb

## 2018-10-02 NOTE — MAU Note (Signed)
Pt. Presents to MAU with ctx. Pt. Came running through MAU doors. Pt. Was immediately escorted into a room. Pt. Was then checked my Karl Ito @ [phone]. Pt's water broke and was complete. Anyanwu was called by myself at 0700. 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

## 2018-10-02 NOTE — MAU Note (Signed)
Dr. Macon Large at bedside, bedside U/S shows foot presenting, pt had previously been told she was transverse.  IV started, labs drawn, pt requesting pain medication.  Decision made for C/S.

## 2018-10-02 NOTE — Op Note (Signed)
Preoperative diagnosis:  1.  Intrauterine pregnancy at [redacted]w[redacted]d  weeks gestation                                         2.  Cord prolapse                                         3.  Double footing breech   Postoperative diagnosis:  Same as above  Procedure:  Primary cesarean section, emergency  Surgeon:  Lazaro Arms MD  Assistant:    Anesthesia: General  Findings:  Pt presented to MAU with double footling breech and cord prolapse.Proceeded immediately with emergency Caesarean section   Over a low transverse incision was delivered a viable female with Apgars of 1 and 9 weighing pending lbs.  oz. Uterus, tubes and ovaries were all normal.  There were no other significant findings  Description of operation:  Pt was prepped and draped, foley catheter placed prior to induction of anesthesia.  Patient was taken to the operating room underwent general  Anesthetic  A Pfannenstiel skin incision was made and carried down sharply to the rectus fascia which was scored in the midline extended laterally. The fascia was taken off the muscles both superiorly and without difficulty. The muscles were divided.  The peritoneal cavity was entered.  Bladder blade was placed, no bladder flap was created.  A low transverse hysterotomy incision was made, of note, both feet were well out of the vagina with the cord which of course had to be brought back up through the vagina which significantly increases the post op infection rate.  delivered a viable female  infant at 61 with Apgars of 1 and 9 weighingpending lbs  oz.  Cord pH was obtained and was 7.16. The uterus was exteriorized. It was closed in 2 layers, the first being a running interlocking layer and the second being an imbricating layer using 0 monocryl on a CTX needle. There was good resulting hemostasis. The uterus tubes and ovaries were all normal. Peritoneal cavity was irrigated vigorously. The muscles and peritoneum were reapproximated loosely. The fascia  was closed using 0 Vicryl in running fashion. Subcutaneous tissue was made hemostatic and irrigated. The skin was closed using skin staples.  Blood loss for the procedure was 750 cc. The patient received  Ancef prophylactically. The patient was taken to the recovery room in good stable condition with all counts being correct x3.  EBL 750 cc  Lazaro Arms 10/02/2018 8:33 AM

## 2018-10-02 NOTE — MAU Note (Signed)
Bonna Gains CNM @ bedside, bedside U/S performed to verify presentation.

## 2018-10-02 NOTE — Anesthesia Postprocedure Evaluation (Signed)
Anesthesia Post Note  Patient: [name]  Procedure(s) Performed: CESAREAN SECTION (N/A Abdomen)     Patient location during evaluation: Mother Baby Anesthesia Type: General Level of consciousness: awake and alert Pain management: satisfactory to patient Vital Signs Assessment: post-procedure vital signs reviewed and stable Respiratory status: spontaneous breathing and respiratory function stable Cardiovascular status: stable Postop Assessment: adequate PO intake Anesthetic complications: no    Last Vitals:  Vitals:   10/02/18 1300 10/02/18 1736  BP: 135/88 138/82  Pulse: 92 85  Resp: 18 18  Temp: 36.8 C 37.1 C  SpO2: 96%     Last Pain:  Vitals:   10/02/18 1736  TempSrc: Oral  PainSc:    Pain Goal:                 Draycen Leichter

## 2018-10-02 NOTE — Anesthesia Procedure Notes (Signed)
Procedure Name: Intubation Date/Time: 10/02/2018 7:23 AM Performed by: Rica Records, CRNA Pre-anesthesia Checklist: Patient identified, Emergency Drugs available, Suction available and Patient being monitored Patient Re-evaluated:Patient Re-evaluated prior to induction Oxygen Delivery Method: Circle system utilized Preoxygenation: Pre-oxygenation with 100% oxygen Induction Type: IV induction, Cricoid Pressure applied and Rapid sequence Laryngoscope Size: Miller and 2 Grade View: Grade I Laser Tube: Cuffed inflated with minimal occlusive pressure - saline Placement Confirmation: ETT inserted through vocal cords under direct vision and positive ETCO2 Secured at: 22 cm Tube secured with: Tape Dental Injury: Teeth and Oropharynx as per pre-operative assessment

## 2018-10-02 NOTE — Addendum Note (Signed)
Addendum  created 10/02/18 1809 by Graciela Husbands, CRNA   Sign clinical note

## 2018-10-02 NOTE — Anesthesia Preprocedure Evaluation (Signed)
Anesthesia Evaluation  Patient identified by MRN, date of birth, ID band Patient awake    Reviewed: Allergy & Precautions, H&P , NPO status , Patient's Chart, lab work & pertinent test results, reviewed documented beta blocker date and time   History of Anesthesia Complications Negative for: history of anesthetic complications  Airway Mallampati: I  TM Distance: >3 FB Neck ROM: full    Dental no notable dental hx. (+) Teeth Intact   Pulmonary neg pulmonary ROS, PE (2012 - was on lovenox until 7/2, took first dose of heparin on 7/6, last dose was 7/6 at 1500.  )   Pulmonary exam normal breath sounds clear to auscultation       Cardiovascular negative cardio ROS Normal cardiovascular exam Rhythm:regular Rate:Normal     Neuro/Psych negative neurological ROS  negative psych ROS   GI/Hepatic negative GI ROS, Neg liver ROS,   Endo/Other  negative endocrine ROS  Renal/GU negative Renal ROS  negative genitourinary   Musculoskeletal negative musculoskeletal ROS (+)   Abdominal   Peds negative pediatric ROS (+)  Hematology negative hematology ROS (+)   Anesthesia Other Findings   Reproductive/Obstetrics negative OB ROS (+) Pregnancy                             Anesthesia Physical  Anesthesia Plan  ASA: II and emergent  Anesthesia Plan: General   Post-op Pain Management:    Induction: Intravenous, Rapid sequence and Cricoid pressure planned  PONV Risk Score and Plan: 3 and Treatment may vary due to age or medical condition, Ondansetron, Dexamethasone and Midazolam  Airway Management Planned: Oral ETT  Additional Equipment:   Intra-op Plan:   Post-operative Plan: Extubation in OR  Informed Consent: I have reviewed the patients History and Physical, chart, labs and discussed the procedure including the risks, benefits and alternatives for the proposed anesthesia with the patient or  authorized representative who has indicated his/her understanding and acceptance.   Dental advisory given  Plan Discussed with: CRNA  Anesthesia Plan Comments:         Anesthesia Quick Evaluation

## 2018-10-02 NOTE — Transfer of Care (Signed)
Immediate Anesthesia Transfer of Care Note  Patient: [name]  Procedure(s) Performed: CESAREAN SECTION (N/A )  Patient Location: PACU  Anesthesia Type:General  Level of Consciousness: awake, alert  and oriented  Airway & Oxygen Therapy: Patient Spontanous Breathing  Post-op Assessment: Report given to RN and Post -op Vital signs reviewed and stable  Post vital signs: Reviewed and stable  Last Vitals:  Vitals Value Taken Time  BP    Temp    Pulse    Resp    SpO2      Last Pain: There were no vitals filed for this visit.       Complications: No apparent anesthesia complications

## 2018-10-02 NOTE — Lactation Note (Signed)
This note was copied from a baby's chart. Lactation Consultation Note  Patient Name: [name] ZOXWR'U Date: 10/02/2018 Reason for consult: Initial assessment;Early term 50-38.6wks  P3 mother whose infant is now 69 hours old.  This is an ETI at 37+6 weeks weighing > 6 lbs.  Mother has 2 children (30 and 27 years old) whom she breast fed.  Baby is swaddled and in mother's arms as I entered.  Mother has attempted breast feeding 5 times since delivery.  Encouraged to feed 8-12 times/24 hours or sooner if baby shows feeding cues.  Reviewed feeding cues.  Hand expression taught and mother did a return demonstration.  She was able to express one drop of colostrum at this time.  Colostrum container provided for any EBM she obtains with hand expression.  Milk storage times reviewed.  Offered to initiate the DEBP to help stimulate and increase milk production and mother may do this in the a.m.  She will continue watching for cues and feed at least every 3 hours.  Hand expression encouraged before/after feedings.  She will call for latch assistance as needed.    Mom made aware of O/P services, breastfeeding support groups, community resources, and our phone # for post-discharge questions. Mother works from home and stated that she will begin pumping and storing milk earlier than she did with her last child.  She would like father to feed more.  She has a DEBP for home use.   Maternal Data Formula Feeding for Exclusion: No Has patient been taught Hand Expression?: Yes Does the patient have breastfeeding experience prior to this delivery?: Yes  Feeding    LATCH Score                   Interventions    Lactation Tools Discussed/Used     Consult Status Consult Status: Follow-up Date: 10/03/18 Follow-up type: In-patient    [name] 10/02/2018, 7:58 PM

## 2018-10-02 NOTE — Progress Notes (Signed)
EFM tracing intermittent, pt thrashing with pain, D. Ansah-Mensah RN in bed with patient, holding presenting part off of prolapsed cord.  Pt to OR.

## 2018-10-02 NOTE — MAU Note (Signed)
Stat C/S called by Dr. Macon Large - cord prolapse

## 2018-10-03 ENCOUNTER — Telehealth: Payer: Self-pay | Admitting: *Deleted

## 2018-10-03 LAB — CBC
HCT: 30.2 % — ABNORMAL LOW (ref 36.0–46.0)
Hemoglobin: 10.4 g/dL — ABNORMAL LOW (ref 12.0–15.0)
MCH: 26.6 pg (ref 26.0–34.0)
MCHC: 34.8 g/dL (ref 30.0–36.0)
MCV: 76.5 fL — AB (ref 80.0–100.0)
NRBC: 0 % (ref 0.0–0.2)
PLATELETS: 233 10*3/uL (ref 150–400)
RBC: 3.95 MIL/uL (ref 3.87–5.11)
RDW: 17.1 % — AB (ref 11.5–15.5)
WBC: 18 10*3/uL — ABNORMAL HIGH (ref 4.0–10.5)

## 2018-10-03 MED ORDER — ENOXAPARIN SODIUM 60 MG/0.6ML ~~LOC~~ SOLN
50.0000 mg | SUBCUTANEOUS | Status: DC
  Administered 2018-10-03 – 2018-10-04 (×2): 50 mg via SUBCUTANEOUS
  Filled 2018-10-03 (×3): qty 0.6

## 2018-10-03 NOTE — Progress Notes (Addendum)
POSTPARTUM PROGRESS NOTE  Post Partum Day 1  Subjective:  [name] is a 27 y.o. [id number] s/p pLTCS at [redacted]w[redacted]d.  She reports she is doing well. No acute events overnight. She denies any problems with ambulating, voiding or po intake. Denies nausea or vomiting. She has not passed any gas or had a BM, but has taken stool softeners. Pain is well controlled.  Lochia is slowing down.  Objective: Blood pressure 129/78, pulse 82, temperature 98.4 F (36.9 C), temperature source Oral, resp. rate 16, height 5\' 6"  (1.676 m), weight 102.1 kg, SpO2 98 %, unknown if currently breastfeeding.  Physical Exam:  General: alert, cooperative and no distress Chest: no respiratory distress Heart:regular rate, distal pulses intact Abdomen: soft, nontender,  Uterine Fundus: firm, appropriately tender DVT Evaluation: No calf swelling or tenderness Extremities: No edema Skin: warm, dry  Recent Labs    10/02/18 0710 10/03/18 0556  HGB 13.4 10.4*  HCT 37.6 30.2*    Assessment/Plan: [name] is a 27 y.o. [id number] s/p pLTCS at [redacted]w[redacted]d   PPD#1 - Doing well  Routine postpartum care Contraception: Has not decided, history of clotting Feeding: Breastfeeding Dispo: Plan for discharge tomorrow.   LOS: 1 day   Tracy Hose PA Student 10/03/18 7:39 am  I confirm that I have verified the information documented in the PA student's note and that I have also personally performed the physical exam and all medical decision making activities.   [name], CNM 10/03/18  12:02 PM

## 2018-10-03 NOTE — Plan of Care (Signed)
Pts. Condition will continue to improve 

## 2018-10-03 NOTE — Progress Notes (Addendum)
Patient passed large blood clot during void. Clot was left in bathroom for examination by physician. Fundal massage performed after voiding did not produce any trickling or clots and fundus was firm and U/1. Will continue to monitor.

## 2018-10-03 NOTE — Lactation Note (Signed)
This note was copied from a baby's chart. Lactation Consultation Note  Patient Name: [name] ZOXWR'U Date: 10/03/2018 Reason for consult: Follow-up assessment;Early term 41-38.6wks  P3 mother whose infant is now 51 hours old.  Mother was getting ready to awaken baby and put to breast when I arrived.  She stated that, at about 0300 this morning, baby finally latched.  Mother felt relieved that baby latched.  She told me that she was "just about to give her some formula" and then she latched.  2 bottles of unopened formula were sitting at bedside.  I offered to assist with latching but mother politely declined.  She told me she would call if she needed assistance.  I encouraged to feed now and call as needed.  Mother verbalized understanding.   Maternal Data Formula Feeding for Exclusion: No Has patient been taught Hand Expression?: Yes Does the patient have breastfeeding experience prior to this delivery?: Yes  Feeding Feeding Type: (enc STS; mother states she is about to feed; decl assistance)  LATCH Score                   Interventions    Lactation Tools Discussed/Used     Consult Status Consult Status: Follow-up Date: 10/04/18 Follow-up type: In-patient    [name] 10/03/2018, 12:31 PM

## 2018-10-03 NOTE — Progress Notes (Signed)
CSW received consult for MOB having limited PNC. MOB received initial OB visit at 16 weeks and had more than 3 routine visits.   Please re consult CSW for any other needs.   Dan Dissinger, LCSW Clinical Social Worker  [facility]  [phone]  

## 2018-10-03 NOTE — Telephone Encounter (Signed)
Lmom for pt to call us back to schedule pp appointment.  10-03-18  AS 

## 2018-10-04 ENCOUNTER — Telehealth: Payer: Self-pay | Admitting: Family Medicine

## 2018-10-04 LAB — RPR: RPR Ser Ql: NONREACTIVE

## 2018-10-04 MED ORDER — IBUPROFEN 600 MG PO TABS: 600.0000 mg | ORAL_TABLET | Freq: Four times a day (QID) | ORAL | 0 refills | Status: DC

## 2018-10-04 MED ORDER — ENOXAPARIN SODIUM 80 MG/0.8ML ~~LOC~~ SOLN: 80.0000 mg | SUBCUTANEOUS | 6 refills | Status: DC

## 2018-10-04 MED ORDER — PRENATAL MULTIVITAMIN CH: 1.0000 | ORAL_TABLET | Freq: Every day | ORAL | 11 refills | Status: DC

## 2018-10-04 MED ORDER — OXYCODONE HCL 5 MG PO TABS: 5.0000 mg | ORAL_TABLET | ORAL | 0 refills | Status: DC | PRN

## 2018-10-04 MED ORDER — ENOXAPARIN SODIUM 80 MG/0.8ML ~~LOC~~ SOLN: 50.0000 mg | SUBCUTANEOUS | 6 refills | Status: DC

## 2018-10-04 MED ORDER — ACYCLOVIR 400 MG PO TABS: 400.0000 mg | ORAL_TABLET | Freq: Three times a day (TID) | ORAL | 3 refills | Status: DC

## 2018-10-04 MED ORDER — ACETAMINOPHEN 325 MG PO TABS: 650.0000 mg | ORAL_TABLET | ORAL | 0 refills | Status: DC | PRN

## 2018-10-04 NOTE — Lactation Note (Signed)
This note was copied from a baby's chart. Lactation Consultation Note  Patient Name: [name] ZOXWR'U Date: 10/04/2018 Reason for consult: Follow-up assessment   P3, Baby 50 hours old. Mother states baby is doing well now well w/ breastfeeding.   Denies questions or concerns. Mom encouraged to feed baby 8-12 times/24 hours and with feeding cues.  Reviewed engorgement care and monitoring voids/stools. Provided mother w/ manual pump.    Maternal Data    Feeding Feeding Type: Breast Fed  LATCH Score                   Interventions    Lactation Tools Discussed/Used     Consult Status Consult Status: Follow-up Date: 10/05/18    [name] [facility] 10/04/2018, 10:09 AM

## 2018-10-04 NOTE — Discharge Summary (Addendum)
Obstetrics Discharge Summary OB/GYN Faculty Practice   Patient Name: [name] DOB: June 10, 1991 MRN: [id number]  Date of admission: 10/02/2018 Delivering MD: Lazaro Arms   Date of discharge: 10/04/2018  Admitting diagnosis: 37 WKS CTX Intrauterine pregnancy: [redacted]w[redacted]d     Secondary diagnosis:   Principal Problem:   Umbilical cord, prolapsed Active Problems:   History of pulmonary embolus (PE)   Family history of pulmonary embolism   HSV-2 (herpes simplex virus 2) infection   Sickle cell trait (HCC)   Supervision of normal pregnancy   Breech presentation, single footling   S/P cesarean section   Additional problems:  . Late to [facility] . History of PE on chronic anticoagulation     Discharge diagnosis: Delivery via cesarea section, cord prolapse                                            Postpartum procedures: None  Complications: Cord prolapse requiring Cesarean section  Hospital course: Tzipora Mcinroy is a 27 y.o. [redacted]w[redacted]d who was admitted for cord prolapse. Her pregnancy was complicated by being late to [facility], history of PE requiring chronic Lovenox treatment during pregnancy. Patient presented to the MAU after SROM and found to be breech with cord prolapse, required terb and stat C-section.  The CS was unremarkable and mom did well. Please see op note for additional details. Her postpartum course was uncomplicated. She was breastfeeding without difficulty. By day of discharge, she was passing flatus, urinating, eating and drinking without difficulty. Her pain was well-controlled, and she was discharged home on Lovenox. She will follow-up in clinic in 1 week for staple removal and 4-6 weeks for routine postpartum visit.   Physical exam  Vitals:   10/03/18 0516 10/03/18 1353 10/03/18 2100 10/04/18 0602  BP: 129/78 108/64 114/67 (!) 96/59  Pulse: 82 84 99 82  Resp: 16 18 20 18   Temp: 98.4 F (36.9 C) 98.7 F (37.1 C) 98.3 F (36.8 C) 97.7 F (36.5 C)  TempSrc: Oral Oral  Oral   SpO2: 98%  99% 96%  Weight:      Height:       General: Well-appearing, NAD Lochia: appropriate Uterine Fundus: firm Incision: Healing well with no significant drainage, No significant erythema, Dressing is clean, dry, and intact DVT Evaluation: No evidence of DVT seen on physical exam. Labs: Lab Results  Component Value Date   WBC 18.0 (H) 10/03/2018   HGB 10.4 (L) 10/03/2018   HCT 30.2 (L) 10/03/2018   MCV 76.5 (L) 10/03/2018   PLT 233 10/03/2018   CMP Latest Ref Rng & Units 01/01/2014  Glucose 70 - 99 mg/dL 89  BUN 6 - 23 mg/dL 4(L)  Creatinine 4.09 - 1.10 mg/dL 8.11  Sodium 914 - 782 mEq/L 138  Potassium 3.7 - 5.3 mEq/L 3.9  Chloride 96 - 112 mEq/L 103  CO2 19 - 32 mEq/L 24  Calcium 8.4 - 10.5 mg/dL 9.1  Total Protein 6.0 - 8.3 g/dL 6.8  Total Bilirubin 0.3 - 1.2 mg/dL <9.5(A)  Alkaline Phos 39 - 117 U/L 40  AST 0 - 37 U/L 14  ALT 0 - 35 U/L 9    Discharge instructions: Per After Visit Summary and "Baby and Me Booklet"  After visit meds:  Allergies as of 10/04/2018   No Known Allergies     Medication List    STOP taking these  medications   acyclovir 400 MG tablet Commonly known as:  ZOVIRAX     TAKE these medications   acetaminophen 325 MG tablet Commonly known as:  TYLENOL Take 2 tablets (650 mg total) by mouth every 4 (four) hours as needed (for pain scale < 4).   enoxaparin 80 MG/0.8ML injection Commonly known as:  LOVENOX Inject 0.8 mLs (80 mg total) into the skin daily.   ibuprofen 600 MG tablet Commonly known as:  ADVIL,MOTRIN Take 1 tablet (600 mg total) by mouth every 6 (six) hours.   oxyCODONE 5 MG immediate release tablet Commonly known as:  Oxy IR/ROXICODONE Take 1 tablet (5 mg total) by mouth every 4 (four) hours as needed for severe pain or breakthrough pain.   prenatal multivitamin Tabs tablet Take 1 tablet by mouth daily at 12 noon.       Postpartum contraception: IUD Mirena Diet: Routine Diet Activity: Advance as tolerated.  Pelvic rest for 6 weeks.   Outpatient follow up:1 week, 4-6 weeks Follow-up Appt: Future Appointments  Date Time Provider Department Center  10/11/2018 11:15 AM Lazaro Arms, MD [facility]  11/09/2018  2:30 PM Cresenzo-Dishmon, Scarlette Calico, CNM [facility]   Follow-up Visit:No follow-ups on file.  Newborn Data: Live born female  Birth Weight: 6 lb 4.2 oz (2840 g) APGAR: 1, 9  Newborn Delivery   Birth date/time:  10/02/2018 07:25:00 Delivery type:  C-Section, Low Transverse Trial of labor:  No C-section categorization:  Primary     Baby Feeding: Breast Disposition:home with mother  Terisa Starr, MD  [facility] FELLOW DISCHARGE ATTESTATION  I have seen and examined this patient and agree with above documentation in the resident's note.   Gwenevere Abbot, MD OB Fellow  10/04/2018, 10:23 AM

## 2018-10-04 NOTE — Telephone Encounter (Signed)
Encounter opened in error

## 2018-10-06 ENCOUNTER — Encounter: Payer: Medicaid Other | Admitting: Women's Health

## 2018-10-11 ENCOUNTER — Encounter: Payer: Self-pay | Admitting: *Deleted

## 2018-10-11 ENCOUNTER — Encounter: Payer: Medicaid Other | Admitting: Obstetrics & Gynecology

## 2018-10-13 ENCOUNTER — Encounter: Payer: Self-pay | Admitting: Obstetrics and Gynecology

## 2018-10-13 ENCOUNTER — Ambulatory Visit (INDEPENDENT_AMBULATORY_CARE_PROVIDER_SITE_OTHER): Payer: Medicaid Other | Admitting: Obstetrics and Gynecology

## 2018-10-13 VITALS — BP 125/70 | HR 93 | Ht 65.5 in | Wt 215.4 lb

## 2018-10-13 DIAGNOSIS — Z09 Encounter for follow-up examination after completed treatment for conditions other than malignant neoplasm: Secondary | ICD-10-CM

## 2018-10-13 NOTE — Progress Notes (Addendum)
Patient ID: [name], female   DOB: 05-27-1991, 27 y.o.   MRN: [id number]   Subjective:  [name] is a 27 y.o. female now 1.5 weeks status post C section. Staples removed today. Pt has been moving around with ease. She is looking at birth control options and loved her Mirena IUD, however cardiology has been hesitant to allow progesterone only contraception in patients with history of pulmonary embolus even though obstetric and gynecologic data would suggest that progesterone only contraception is acceptable in patients with history of thrombosis she was on Coumadin before her pregnancy and is now using Lovenox injections because she is currently breastfeeding.  She plans to breast-feed for at least 6 months.  We will continue the Lovenox for the 6 weeks postpartum, 42 days, and then decide long-term anticoagulation based on its acceptability while breast-feeding  Review of Systems Negative   Diet: normal   Bowel movements : normal.  The patient is not having any pain.  Objective:  BP 125/70 (BP Location: Right Arm, Patient Position: Sitting, Cuff Size: Normal)   Pulse 93   Ht 5' 5.5" (1.664 m)   Wt 215 lb 6.4 oz (97.7 kg)   BMI 35.30 kg/m  General:Well developed, well nourished.  No acute distress. Abdomen: Bowel sounds normal, soft, non-tender.  Staples removed skin edges well approximated Steri-Strips applied Pelvic Exam: DEFERRED  Incision(s): Healing Well  Assessment:  Post-Op 1.5 weeks s/p C section   Doing well postoperatively.  Staples removed, Steri-strips applied to incision.   Plan:  1. Wound care discussed 2. Current medications. Continue Lovenox for 6 wks 3. Activity restrictions: no bending, stooping, or squatting and no overhead lifting 4. return to work: not applicable. 5. Follow up in 4 weeks.   By signing my name below, I, Pietro Cassis, attest that this documentation has been prepared under the direction and in the presence of Tilda Burrow,  MD. Electronically Signed: Pietro Cassis, Medical Scribe. 10/13/18. 9:46 AM.  I personally performed the services described in this documentation, which was SCRIBED in my presence. The recorded information has been reviewed and considered accurate. It has been edited as necessary during review. Tilda Burrow, MD

## 2018-10-27 ENCOUNTER — Ambulatory Visit (INDEPENDENT_AMBULATORY_CARE_PROVIDER_SITE_OTHER): Payer: Medicaid Other | Admitting: Obstetrics and Gynecology

## 2018-10-27 ENCOUNTER — Encounter: Payer: Self-pay | Admitting: Obstetrics and Gynecology

## 2018-10-27 VITALS — BP 114/77 | HR 99 | Ht 66.0 in | Wt 208.4 lb

## 2018-10-27 DIAGNOSIS — Z5189 Encounter for other specified aftercare: Secondary | ICD-10-CM

## 2018-10-27 DIAGNOSIS — Z9889 Other specified postprocedural states: Secondary | ICD-10-CM | POA: Diagnosis not present

## 2018-10-27 NOTE — Progress Notes (Signed)
Patient ID: [name], female   DOB: Apr 05, 1991, 27 y.o.   MRN: [id number]    Subjective:  [name] is a 27 y.o. female now 3.5 weeks status post C section. She has been using Steri-strips.She has developed a 2 cm area of granulation tissue on the right side of the incision which does not hurt, but sometimes when she takes the bandages off, she notices a foul odor. Pt is worried about infection. SHe is changing the dressing every 1 to 2 days  Review of Systems Negative   Diet: normal   Bowel movements : normal.  The patient is not having any pain.  Objective:  BP 114/77 (BP Location: Right Arm, Patient Position: Sitting, Cuff Size: Normal)   Pulse 99   Ht 5\' 6"  (1.676 m)   Wt 208 lb 6.4 oz (94.5 kg)   BMI 33.64 kg/m  General:Well developed, well nourished.  No acute distress. Abdomen: Bowel sounds normal, soft, non-tender. Pelvic Exam: DEFERRED  Incision(s): No infection, but not healing as expected there is a 2 cm strip of granulation tissue about 5 mm deep.  On the right half of the incision which is treated with topical numbing spray and then silver nitrate  Assessment:  Post-Op 3.5 weeks s/p C section  Granulation tissue 2 cm, right side of incision  Applied topical numbing spray and silver nitrate to encourage healing.  Instructed patient on how to place bandages   Plan:  1. Wound care discussed 2. Current medications. 3. Activity restrictions: no bending, stooping, or squatting and no overhead lifting 4. return to work: N/A 5. Follow up as scheduled  By signing my name below, I, [name], attest that this documentation has been prepared under the direction and in the presence of Tilda BurrowFerguson, Yuta Cipollone V, MD. Electronically Signed: Pietro CassisEmily Trevino, Medical Scribe. 10/27/18. 10:34 AM.  I personally performed the services described in this documentation, which was SCRIBED in my presence. The recorded information has been reviewed and considered accurate. It has been  edited as necessary during review. Tilda BurrowJohn V Marquesa Rath, MD

## 2018-11-09 ENCOUNTER — Encounter: Payer: Self-pay | Admitting: Advanced Practice Midwife

## 2018-11-09 ENCOUNTER — Ambulatory Visit (INDEPENDENT_AMBULATORY_CARE_PROVIDER_SITE_OTHER): Payer: Medicaid Other | Admitting: Advanced Practice Midwife

## 2018-11-09 MED ORDER — MEDROXYPROGESTERONE ACETATE 150 MG/ML IM SUSP: 150.0000 mg | INTRAMUSCULAR | 3 refills | Status: DC

## 2018-11-09 NOTE — Progress Notes (Signed)
[name] is a 27 y.o. who presents for a postpartum visit. She is 5 weeks postpartum following a low cervical transverse Cesarean section. I have fully reviewed the prenatal and intrapartum course. The delivery was at 37.6 gestational weeks. She presented to MAU pushing w/double footlng breech/ROM/prolapsed cord.  She had had an ECV the week before  Anesthesia: general. Postpartum course has been complicated by some granulation tissue at CS site, tx w/silver nitrate 11/21. Looks great.. Baby's course has been uneventful. Baby is feeding by breast and bottle, went back to work today, plans to go to bottle. Bleeding: no bleeding. Bowel function is normal. Bladder function is normal. Patient is sexually active. Contraception method is none. Postpartum depression screening: negative.   Current Outpatient Medications:  .  Prenatal Vit-Fe Fumarate-FA (PRENATAL MULTIVITAMIN) TABS tablet, Take 1 tablet by mouth daily at 12 noon., Disp: 30 tablet, Rfl: 11 .  acetaminophen (TYLENOL) 325 MG tablet, Take 2 tablets (650 mg total) by mouth every 4 (four) hours as needed (for pain scale < 4). (Patient not taking: Reported on 10/13/2018), Disp: 30 tablet, Rfl: 0 .  acyclovir (ZOVIRAX) 400 MG tablet, Take 1 tablet (400 mg total) by mouth 3 (three) times daily. (Patient not taking: Reported on 10/13/2018), Disp: 90 tablet, Rfl: 3 .  enoxaparin (LOVENOX) 80 MG/0.8ML injection, Inject 0.8 mLs (80 mg total) into the skin daily. (Patient not taking: Reported on 11/09/2018), Disp: 30 Syringe, Rfl: 6 .  ibuprofen (ADVIL,MOTRIN) 600 MG tablet, Take 1 tablet (600 mg total) by mouth every 6 (six) hours. (Patient not taking: Reported on 11/09/2018), Disp: 30 tablet, Rfl: 0  Review of Systems   Constitutional: Negative for fever and chills Eyes: Negative for visual disturbances Respiratory: Negative for shortness of breath, dyspnea Cardiovascular: Negative for chest pain or palpitations  Gastrointestinal: Negative for  vomiting, diarrhea and constipation Genitourinary: Negative for dysuria and urgency Musculoskeletal: Negative for back pain, joint pain, myalgias  Neurological: Negative for dizziness and headaches    Objective:     Vitals:   11/09/18 1442  BP: 126/74  Pulse: 84   General:  alert, cooperative and no distress   Breasts:  negative  Lungs: Normal respiratory effort  Heart:  regular rate and rhythm  Abdomen: Soft, nontender  Incision looks great   Vulva:  normal  Vagina: normal vagina  Cervix:  closed  Corpus: Well involuted     Rectal Exam: no hemorrhoids        Assessment:    normal postpartum exam.  Plan:   1. Contraception: Depo-Provera injections 2. Follow up in:   or as needed.

## 2018-11-10 ENCOUNTER — Ambulatory Visit (INDEPENDENT_AMBULATORY_CARE_PROVIDER_SITE_OTHER): Payer: Medicaid Other

## 2018-11-10 VITALS — Ht 66.0 in | Wt 207.0 lb

## 2018-11-10 DIAGNOSIS — Z3042 Encounter for surveillance of injectable contraceptive: Secondary | ICD-10-CM

## 2018-11-10 DIAGNOSIS — Z3202 Encounter for pregnancy test, result negative: Secondary | ICD-10-CM

## 2018-11-10 LAB — POCT URINE PREGNANCY: Preg Test, Ur: NEGATIVE

## 2018-11-10 MED ORDER — MEDROXYPROGESTERONE ACETATE 150 MG/ML IM SUSP
150.0000 mg | Freq: Once | INTRAMUSCULAR | Status: AC
  Administered 2018-11-10: 150 mg via INTRAMUSCULAR

## 2018-11-10 NOTE — Progress Notes (Signed)
Pt here for depo injection 150 mg IM given lt deltoid.tolerated well. Return 12 weeks for next injection. Pad CMA

## 2019-02-02 ENCOUNTER — Ambulatory Visit: Payer: Medicaid Other

## 2019-02-10 ENCOUNTER — Other Ambulatory Visit (HOSPITAL_COMMUNITY): Payer: Self-pay | Admitting: Preventative Medicine

## 2019-02-10 ENCOUNTER — Other Ambulatory Visit: Payer: Self-pay | Admitting: Preventative Medicine

## 2019-08-23 ENCOUNTER — Telehealth: Payer: Self-pay | Admitting: *Deleted

## 2019-08-23 NOTE — Telephone Encounter (Signed)
Called patient back and left message to call back to make appt to come in for urine sample.

## 2019-08-23 NOTE — Telephone Encounter (Signed)
Pt left message stating that she needs a uti test.

## 2019-08-24 ENCOUNTER — Other Ambulatory Visit: Payer: Self-pay

## 2019-08-24 ENCOUNTER — Telehealth: Payer: Self-pay

## 2019-08-24 ENCOUNTER — Other Ambulatory Visit: Payer: Self-pay | Admitting: Women's Health

## 2019-08-24 ENCOUNTER — Other Ambulatory Visit (INDEPENDENT_AMBULATORY_CARE_PROVIDER_SITE_OTHER): Payer: Self-pay

## 2019-08-24 DIAGNOSIS — R350 Frequency of micturition: Secondary | ICD-10-CM

## 2019-08-24 LAB — POCT URINALYSIS DIPSTICK OB
Blood, UA: 1
Glucose, UA: NEGATIVE
POC,PROTEIN,UA: NEGATIVE

## 2019-08-24 MED ORDER — SULFAMETHOXAZOLE-TRIMETHOPRIM 800-160 MG PO TABS: 1.0000 | ORAL_TABLET | Freq: Two times a day (BID) | ORAL | 0 refills | Status: DC

## 2019-08-24 NOTE — Addendum Note (Signed)
Addended by: Diona Fanti A on: 08/24/2019 09:24 AM   Modules accepted: Level of Service

## 2019-08-24 NOTE — Progress Notes (Signed)
Pt here for urine sample,having urinary frequency x 2 days.Nitate postive. Spoke kim booker will send urine off culture. Pad CMA

## 2019-08-24 NOTE — Telephone Encounter (Signed)
Pt here for urine sample, having urinary frequency x 2 days. Urine dip +nitrate. Spoke kim booker will send urine for culture. Pad CMA

## 2019-08-26 LAB — URINE CULTURE

## 2019-08-28 ENCOUNTER — Other Ambulatory Visit: Payer: Self-pay | Admitting: Women's Health

## 2019-08-28 MED ORDER — NITROFURANTOIN MONOHYD MACRO 100 MG PO CAPS: 100.0000 mg | ORAL_CAPSULE | Freq: Two times a day (BID) | ORAL | 0 refills | Status: DC

## 2019-08-31 ENCOUNTER — Other Ambulatory Visit: Payer: Medicaid Other

## 2019-09-01 ENCOUNTER — Ambulatory Visit: Payer: Medicaid Other

## 2019-09-05 ENCOUNTER — Other Ambulatory Visit: Payer: Self-pay

## 2019-09-06 ENCOUNTER — Telehealth: Payer: Self-pay | Admitting: *Deleted

## 2019-09-06 NOTE — Telephone Encounter (Signed)
Patient called wanting to have HCG, Depo and swab done on same visit.  Informed that was fine as we did not have any insurance on file for her. Advised to call and change appt.

## 2019-09-07 ENCOUNTER — Other Ambulatory Visit: Payer: Self-pay | Admitting: *Deleted

## 2019-09-07 DIAGNOSIS — Z20822 Contact with and (suspected) exposure to covid-19: Secondary | ICD-10-CM

## 2019-09-08 LAB — NOVEL CORONAVIRUS, NAA: SARS-CoV-2, NAA: NOT DETECTED

## 2019-09-18 ENCOUNTER — Other Ambulatory Visit: Payer: Self-pay

## 2019-09-20 ENCOUNTER — Other Ambulatory Visit: Payer: Self-pay

## 2020-04-30 ENCOUNTER — Other Ambulatory Visit: Payer: Self-pay

## 2020-04-30 ENCOUNTER — Emergency Department (HOSPITAL_COMMUNITY)
Admission: EM | Admit: 2020-04-30 | Discharge: 2020-04-30 | Disposition: A | Discharge status: Home / Self Care | Payer: Self-pay

## 2021-04-08 ENCOUNTER — Ambulatory Visit (INDEPENDENT_AMBULATORY_CARE_PROVIDER_SITE_OTHER): Payer: 59 | Admitting: Adult Health

## 2021-04-08 ENCOUNTER — Other Ambulatory Visit: Payer: Self-pay

## 2021-04-08 ENCOUNTER — Other Ambulatory Visit (HOSPITAL_COMMUNITY)
Admission: RE | Admit: 2021-04-08 | Discharge: 2021-04-08 | Disposition: A | Discharge status: Home / Self Care | Payer: 59 | Source: Ambulatory Visit | Attending: Adult Health | Admitting: Adult Health

## 2021-04-08 ENCOUNTER — Encounter: Payer: Self-pay | Admitting: Adult Health

## 2021-04-08 VITALS — BP 114/73 | HR 76 | Ht 66.0 in | Wt 197.0 lb

## 2021-04-08 DIAGNOSIS — Z01419 Encounter for gynecological examination (general) (routine) without abnormal findings: Secondary | ICD-10-CM | POA: Insufficient documentation

## 2021-04-08 NOTE — Progress Notes (Signed)
Patient ID: [name], female   DOB: 1991/11/24, 30 y.o.   MRN: [id number] History of Present Illness:  [name] is a 30 year old black female,single, [id number], in for a well woman gyn exam and pap. She is self employed, works at home. PCP is Dr Sudie Bailey.  Current Medications, Allergies, Past Medical History, Past Surgical History, Family History and Social History were reviewed in [employer] record.     Review of Systems: Patient denies any headaches, hearing loss, fatigue, blurred vision, shortness of breath, chest pain, abdominal pain, problems with bowel movements, urination, or intercourse.(not active) No joint pain or mood swings.    Physical Exam:BP 114/73 (BP Location: Left Arm, Patient Position: Sitting, Cuff Size: Normal)   Pulse 76   Ht 5\' 6"  (1.676 m)   Wt 197 lb (89.4 kg)   LMP 03/05/2021   BMI 31.80 kg/m  General:  Well developed, well nourished, no acute distress Skin:  Warm and dry Neck:  Midline trachea, normal thyroid, good ROM, no lymphadenopathy Lungs; Clear to auscultation bilaterally Breast:  No dominant palpable mass, retraction, or nipple discharge Cardiovascular: Regular rate and rhythm Abdomen:  Soft, non tender, no hepatosplenomegaly Pelvic:  External genitalia is normal in appearance, no lesions.  The vagina is normal in appearance,+period blood. Urethra has no lesions or masses. The cervix is bulbous. Pap with GC/CHL and HR HPV genotyping performed. Uterus is felt to be normal size, shape, and contour.  No adnexal masses or tenderness noted.Bladder is non tender, no masses felt. Extremities/musculoskeletal:  No swelling or varicosities noted, no clubbing or cyanosis Psych:  No mood changes, alert and cooperative,seems happy AA is 0 Fall risk is low PHQ 9 score is 0 GAD 7 score is 0  Upstream - 04/08/21 1003      Pregnancy Intention Screening   Does the patient want to become pregnant in the next year? No    Does the  patient's partner want to become pregnant in the next year? N/A    Would the patient like to discuss contraceptive options today? No      Contraception Wrap Up   Current Method Abstinence    End Method Abstinence    Contraception Counseling Provided No         Examination chaperoned by Tish RN  Impression and Plan:  1. Encounter for gynecological examination with Papanicolaou smear of cervix Pap sent Physical in 1 year Pap in 3 if normal Declines labs Encouraged to use condoms if has sex

## 2021-04-10 ENCOUNTER — Telehealth: Payer: Self-pay | Admitting: Adult Health

## 2021-04-10 DIAGNOSIS — A749 Chlamydial infection, unspecified: Secondary | ICD-10-CM

## 2021-04-10 LAB — CYTOLOGY - PAP
Chlamydia: POSITIVE — AB
Comment: NEGATIVE
Comment: NEGATIVE
Comment: NORMAL
Diagnosis: NEGATIVE
High risk HPV: NEGATIVE
Neisseria Gonorrhea: NEGATIVE

## 2021-04-10 MED ORDER — DOXYCYCLINE HYCLATE 100 MG PO TABS: 100.0000 mg | ORAL_TABLET | Freq: Two times a day (BID) | ORAL | 0 refills | Status: DC

## 2021-04-10 NOTE — Telephone Encounter (Signed)
Pt informed that pap negative for malignancy and GC and HPV but +chlamydia, will rx doxycycline 100 mg 1 bid x 7 days, no sex and PCO in 4 weeks, [facility] sent, and tell partner needs to be treated, I can treat will need name, dob, allergies and drug store

## 2021-04-24 ENCOUNTER — Other Ambulatory Visit: Payer: Self-pay | Admitting: Adult Health

## 2021-04-24 MED ORDER — FLUCONAZOLE 150 MG PO TABS: ORAL_TABLET | ORAL | 1 refills | Status: DC

## 2021-04-24 NOTE — Progress Notes (Signed)
rx diflucan  

## 2021-08-05 ENCOUNTER — Ambulatory Visit: Payer: Self-pay | Admitting: Nurse Practitioner

## 2021-08-20 ENCOUNTER — Encounter: Payer: Self-pay | Admitting: Nurse Practitioner

## 2021-08-20 ENCOUNTER — Other Ambulatory Visit: Payer: Self-pay

## 2021-08-20 ENCOUNTER — Ambulatory Visit (INDEPENDENT_AMBULATORY_CARE_PROVIDER_SITE_OTHER): Payer: 59 | Admitting: Nurse Practitioner

## 2021-08-20 VITALS — BP 112/70 | HR 84 | Temp 97.2°F | Ht 66.0 in | Wt 204.0 lb

## 2021-08-20 DIAGNOSIS — Z131 Encounter for screening for diabetes mellitus: Secondary | ICD-10-CM | POA: Diagnosis not present

## 2021-08-20 DIAGNOSIS — Z0000 Encounter for general adult medical examination without abnormal findings: Secondary | ICD-10-CM

## 2021-08-20 LAB — POCT URINALYSIS DIP (CLINITEK)
Bilirubin, UA: NEGATIVE
Blood, UA: NEGATIVE
Glucose, UA: NEGATIVE mg/dL
Ketones, POC UA: NEGATIVE mg/dL
Leukocytes, UA: NEGATIVE
Nitrite, UA: NEGATIVE
POC PROTEIN,UA: NEGATIVE
Spec Grav, UA: 1.015 (ref 1.010–1.025)
Urobilinogen, UA: 0.2 E.U./dL
pH, UA: 6.5 (ref 5.0–8.0)

## 2021-08-20 LAB — POCT GLYCOSYLATED HEMOGLOBIN (HGB A1C): Hemoglobin A1C: 5.3 % (ref 4.0–5.6)

## 2021-08-20 LAB — GLUCOSE, POCT (MANUAL RESULT ENTRY): POC Glucose: 105 mg/dl — AB (ref 70–99)

## 2021-08-20 NOTE — Progress Notes (Signed)
[facility] Patient [facility] [address] [place], [place]  [address] Phone:  [phone]   Fax:  [phone] Subjective:   Patient ID: [name], female    DOB: 08-Mar-1991, 30 y.o.   MRN: [id number]  Chief Complaint  Patient presents with   Establish Care    No questions or concerns, just needing PCP.    HPI [name] 30 y.o. female with no significant medical history to the [facility] to establish care. Patient states that she is uncertain of last PCP visit, has been using her OB/Gyn as a primary provider for several years. Denies any complaints today. Denies any chest pain, shortness of breath, HA or dizziness. Denies any blurred vision, numbness or tingling. Currently does not monitor diet or participate in daily exercise.Works from Par[employer].Endorses taking daily vitamin. States that she has a family history of thyroid disease and requesting thyroid screening today. Has had one partner in the past 6 mths, protected. States that she has not been sexually active in 2 mths. Denies any abnormalities in menstrual cycle. LMP the end of last month. Denies any anxiety, depression or other mental health concerns.  Past Medical History:  Diagnosis Date   Abnormal Pap smear    Chlamydia    Contraceptive management 08/20/2014   Family history of pulmonary embolism 02/08/2013   Father   History of abnormal Pap smear 10/16/2013   History of pulmonary embolus (PE) 08/20/2014   History of trichomoniasis 04/15/2016   HSV-2 seropositive    never had outbreak   Hx of blood clots    PE (pulmonary embolism) 02/08/2013   pulmonary embolus in May 2012 with infarction of the right lower lobe as well as pain in her lower legs presumably the site of origin.    Supervision of high risk pregnancy in first trimester 10/16/2013   On lovenox 120 mg daily in 1 dose   Vaginal Pap smear, abnormal     Past Surgical History:  Procedure Laterality Date   CESAREAN SECTION N/A 10/02/2018    Procedure: CESAREAN SECTION;  Surgeon: Lazaro Arms, MD;  Location: [facility] BIRTHING SUITES;  Service: Obstetrics;  Laterality: N/A;   INDUCED ABORTION  2012,2013   INDUCED ABORTION  02/08/16    Family History  Problem Relation Age of Onset   Diabetes Maternal Grandmother    Hypertension Maternal Grandmother    CAD Maternal Grandmother     Social History   Socioeconomic History   Marital status: Single    Spouse name: Not on file   Number of children: 3   Years of education: Not on file   Highest education level: Not on file  Occupational History   Not on file  Tobacco Use   Smoking status: Never   Smokeless tobacco: Never  Vaping Use   Vaping Use: Never used  Substance and Sexual Activity   Alcohol use: Not Currently    Comment: wine occ   Drug use: No   Sexual activity: Not Currently    Birth control/protection: None  Other Topics Concern   Not on file  Social History Narrative   Not on file   Social Determinants of Health   Financial Resource Strain: Low Risk    Difficulty of Paying Living Expenses: Not hard at all  Food Insecurity: No Food Insecurity   Worried About Programme researcher, broadcasting/film/video in the Last Year: Never true   Ran Out of Food in the Last Year: Never true  Transportation Needs:  No Transportation Needs   Lack of Transportation (Medical): No   Lack of Transportation (Non-Medical): No  Physical Activity: Sufficiently Active   Days of Exercise per Week: 3 days   Minutes of Exercise per Session: 60 min  Stress: No Stress Concern Present   Feeling of Stress : Not at all  Social Connections: Moderately Isolated   Frequency of Communication with Friends and Family: More than three times a week   Frequency of Social Gatherings with Friends and Family: Twice a week   Attends Religious Services: Never   Database administrator or Organizations: Yes   Attends Engineer, structural: 1 to 4 times per year   Marital Status: Divorced  Catering manager Violence:  Not At Risk   Fear of Current or Ex-Partner: No   Emotionally Abused: No   Physically Abused: No   Sexually Abused: No    Outpatient Medications Prior to Visit  Medication Sig Dispense Refill   acetaminophen (TYLENOL) 325 MG tablet Take 2 tablets (650 mg total) by mouth every 4 (four) hours as needed (for pain scale < 4). 30 tablet 0   acyclovir (ZOVIRAX) 400 MG tablet Take 1 tablet (400 mg total) by mouth 3 (three) times daily. (Patient not taking: No sig reported) 90 tablet 3   doxycycline (VIBRA-TABS) 100 MG tablet Take 1 tablet (100 mg total) by mouth 2 (two) times daily. 14 tablet 0   fluconazole (DIFLUCAN) 150 MG tablet Take 1 now and 1 in 3 days if needed 2 tablet 1   methylphenidate (RITALIN) 10 MG tablet Take 10 mg by mouth every morning.     No facility-administered medications prior to visit.    No Known Allergies  Review of Systems  Constitutional:  Negative for chills, fever and malaise/fatigue.  HENT: Negative.    Eyes: Negative.   Respiratory:  Negative for cough and shortness of breath.   Cardiovascular:  Negative for chest pain, palpitations and leg swelling.  Gastrointestinal:  Negative for abdominal pain, blood in stool, constipation, diarrhea, nausea and vomiting.  Musculoskeletal: Negative.   Skin: Negative.   Neurological: Negative.   Psychiatric/Behavioral:  Negative for depression. The patient is not nervous/anxious.   All other systems reviewed and are negative.     Objective:    Physical Exam Vitals reviewed.  Constitutional:      General: She is not in acute distress.    Appearance: Normal appearance.  HENT:     Head: Normocephalic.     Nose: Nose normal.     Mouth/Throat:     Mouth: Mucous membranes are moist.  Eyes:     Extraocular Movements: Extraocular movements intact.     Pupils: Pupils are equal, round, and reactive to light.  Cardiovascular:     Rate and Rhythm: Normal rate and regular rhythm.     Pulses: Normal pulses.     Heart  sounds: Normal heart sounds.     Comments: No obvious peripheral edema Pulmonary:     Effort: Pulmonary effort is normal.     Breath sounds: Normal breath sounds.  Musculoskeletal:        General: Normal range of motion.  Skin:    General: Skin is warm and dry.     Capillary Refill: Capillary refill takes less than 2 seconds.  Neurological:     General: No focal deficit present.     Mental Status: She is alert and oriented to person, place, and time.  Psychiatric:  Mood and Affect: Mood normal.        Behavior: Behavior normal.        Thought Content: Thought content normal.        Judgment: Judgment normal.    BP 112/70 (BP Location: Right Arm, Patient Position: Sitting)   Pulse 84   Temp (!) 97.2 F (36.2 C)   Ht 5\' 6"  (1.676 m)   Wt 204 lb 0.6 oz (92.6 kg)   LMP 08/05/2021   SpO2 97%   Breastfeeding No   BMI 32.93 kg/m  Wt Readings from Last 3 Encounters:  08/20/21 204 lb 0.6 oz (92.6 kg)  04/08/21 197 lb (89.4 kg)  11/10/18 207 lb (93.9 kg)    Immunization History  Administered Date(s) Administered   Influenza-Unspecified 12/31/2017   Tdap 06/13/2014, 08/10/2018    Diabetic Foot Exam - Simple   No data filed     Lab Results  Component Value Date   TSH 0.578 10/16/2013   Lab Results  Component Value Date   WBC 18.0 (H) 10/03/2018   HGB 10.4 (L) 10/03/2018   HCT 30.2 (L) 10/03/2018   MCV 76.5 (L) 10/03/2018   PLT 233 10/03/2018   Lab Results  Component Value Date   NA 138 01/01/2014   K 3.9 01/01/2014   CO2 24 01/01/2014   GLUCOSE 89 01/01/2014   BUN 4 (L) 01/01/2014   CREATININE 0.53 01/01/2014   BILITOT <0.2 (L) 01/01/2014   ALKPHOS 40 01/01/2014   AST 14 01/01/2014   ALT 9 01/01/2014   PROT 6.8 01/01/2014   ALBUMIN 3.1 (L) 01/01/2014   CALCIUM 9.1 01/01/2014   No results found for: CHOL No results found for: HDL No results found for: LDLCALC No results found for: TRIG No results found for: CHOLHDL Lab Results  Component  Value Date   HGBA1C 5.3 08/20/2021       Assessment & Plan:   Problem List Items Addressed This Visit   None Visit Diagnoses     Healthcare maintenance    -  Primary   Relevant Orders   POCT URINALYSIS DIP (CLINITEK) (Completed)   CBC with Differential/Platelet   Comprehensive metabolic panel   Lipid panel   TSH   T3, Free   T4, Free Discussed methods for improving diet and exercise efforts.    Screening for diabetes mellitus       Relevant Orders   HgB A1c (Completed): 5.3, unremarkable and reassuring.   Glucose (CBG) (Completed)   Follow up in 1 yr for annual wellness, sooner as needed    I have discontinued Dellie Plaia's doxycycline, fluconazole, and methylphenidate. I am also having her maintain her acetaminophen and acyclovir.  No orders of the defined types were placed in this encounter.    08/22/2021, NP

## 2021-08-20 NOTE — Patient Instructions (Signed)
You were seen today in the [facility] to establish care. Labs were collected, results will be available via MyChart or, if abnormal, you will be contacted by clinic staff. Please follow up in 1 yr for reevaluation.

## 2021-08-21 LAB — COMPREHENSIVE METABOLIC PANEL
ALT: 12 IU/L (ref 0–32)
AST: 17 IU/L (ref 0–40)
Albumin/Globulin Ratio: 2 (ref 1.2–2.2)
Albumin: 4.7 g/dL (ref 3.9–5.0)
Alkaline Phosphatase: 67 IU/L (ref 44–121)
BUN/Creatinine Ratio: 13 (ref 9–23)
BUN: 10 mg/dL (ref 6–20)
Bilirubin Total: 0.3 mg/dL (ref 0.0–1.2)
CO2: 25 mmol/L (ref 20–29)
Calcium: 10 mg/dL (ref 8.7–10.2)
Chloride: 102 mmol/L (ref 96–106)
Creatinine, Ser: 0.75 mg/dL (ref 0.57–1.00)
Globulin, Total: 2.4 g/dL (ref 1.5–4.5)
Glucose: 91 mg/dL (ref 65–99)
Potassium: 4.4 mmol/L (ref 3.5–5.2)
Sodium: 139 mmol/L (ref 134–144)
Total Protein: 7.1 g/dL (ref 6.0–8.5)
eGFR: 110 mL/min/{1.73_m2} (ref >59)

## 2021-08-21 LAB — CBC WITH DIFFERENTIAL/PLATELET
Basophils Absolute: 0 10*3/uL (ref 0.0–0.2)
Basos: 0 %
EOS (ABSOLUTE): 0 10*3/uL (ref 0.0–0.4)
Eos: 1 %
Hematocrit: 39.3 % (ref 34.0–46.6)
Hemoglobin: 13 g/dL (ref 11.1–15.9)
Immature Grans (Abs): 0 10*3/uL (ref 0.0–0.1)
Immature Granulocytes: 0 %
Lymphocytes Absolute: 2.1 10*3/uL (ref 0.7–3.1)
Lymphs: 26 %
MCH: 26.8 pg (ref 26.6–33.0)
MCHC: 33.1 g/dL (ref 31.5–35.7)
MCV: 81 fL (ref 79–97)
Monocytes Absolute: 0.6 10*3/uL (ref 0.1–0.9)
Monocytes: 7 %
Neutrophils Absolute: 5.3 10*3/uL (ref 1.4–7.0)
Neutrophils: 66 %
Platelets: 313 10*3/uL (ref 150–450)
RBC: 4.85 x10E6/uL (ref 3.77–5.28)
RDW: 13.6 % (ref 11.7–15.4)
WBC: 8.1 10*3/uL (ref 3.4–10.8)

## 2021-08-21 LAB — LIPID PANEL
Chol/HDL Ratio: 3.9 ratio (ref 0.0–4.4)
Cholesterol, Total: 216 mg/dL — ABNORMAL HIGH (ref 100–199)
HDL: 56 mg/dL (ref >39)
LDL Chol Calc (NIH): 143 mg/dL — ABNORMAL HIGH (ref 0–99)
Triglycerides: 93 mg/dL (ref 0–149)
VLDL Cholesterol Cal: 17 mg/dL (ref 5–40)

## 2021-08-21 LAB — T3, FREE: T3, Free: 3.7 pg/mL (ref 2.0–4.4)

## 2021-08-21 LAB — TSH: TSH: 0.811 u[IU]/mL (ref 0.450–4.500)

## 2021-08-21 LAB — T4, FREE: Free T4: 1.26 ng/dL (ref 0.82–1.77)

## 2021-08-29 ENCOUNTER — Ambulatory Visit: Payer: 59 | Admitting: Nurse Practitioner

## 2022-01-01 ENCOUNTER — Emergency Department (HOSPITAL_BASED_OUTPATIENT_CLINIC_OR_DEPARTMENT_OTHER)
Admission: EM | Admit: 2022-01-01 | Discharge: 2022-01-01 | Discharge status: Absent without leave | Payer: 59 | Source: Home / Self Care

## 2022-01-01 ENCOUNTER — Emergency Department (HOSPITAL_COMMUNITY)
Admission: EM | Admit: 2022-01-01 | Discharge: 2022-01-01 | Disposition: A | Discharge status: Home / Self Care | Payer: 59 | Attending: Emergency Medicine | Admitting: Emergency Medicine

## 2022-01-01 ENCOUNTER — Other Ambulatory Visit: Payer: Self-pay

## 2022-01-01 ENCOUNTER — Encounter (HOSPITAL_COMMUNITY): Payer: Self-pay | Admitting: *Deleted

## 2022-01-01 ENCOUNTER — Emergency Department (HOSPITAL_COMMUNITY): Payer: 59

## 2022-01-01 DIAGNOSIS — R1011 Right upper quadrant pain: Secondary | ICD-10-CM

## 2022-01-01 DIAGNOSIS — K8020 Calculus of gallbladder without cholecystitis without obstruction: Secondary | ICD-10-CM | POA: Insufficient documentation

## 2022-01-01 LAB — URINALYSIS, ROUTINE W REFLEX MICROSCOPIC
Bilirubin Urine: NEGATIVE
Glucose, UA: NEGATIVE mg/dL
Ketones, ur: 5 mg/dL — AB
Nitrite: NEGATIVE
Protein, ur: NEGATIVE mg/dL
Specific Gravity, Urine: 1.017 (ref 1.005–1.030)
pH: 6 (ref 5.0–8.0)

## 2022-01-01 LAB — COMPREHENSIVE METABOLIC PANEL
ALT: 57 U/L — ABNORMAL HIGH (ref 0–44)
AST: 53 U/L — ABNORMAL HIGH (ref 15–41)
Albumin: 4.2 g/dL (ref 3.5–5.0)
Alkaline Phosphatase: 52 U/L (ref 38–126)
Anion gap: 6 (ref 5–15)
BUN: 15 mg/dL (ref 6–20)
CO2: 27 mmol/L (ref 22–32)
Calcium: 9.1 mg/dL (ref 8.9–10.3)
Chloride: 104 mmol/L (ref 98–111)
Creatinine, Ser: 0.79 mg/dL (ref 0.44–1.00)
GFR, Estimated: >60 mL/min (ref >60)
Glucose, Bld: 104 mg/dL — ABNORMAL HIGH (ref 70–99)
Potassium: 3.8 mmol/L (ref 3.5–5.1)
Sodium: 137 mmol/L (ref 135–145)
Total Bilirubin: 0.5 mg/dL (ref 0.3–1.2)
Total Protein: 7.8 g/dL (ref 6.5–8.1)

## 2022-01-01 LAB — CBC WITH DIFFERENTIAL/PLATELET
Abs Immature Granulocytes: 0.01 10*3/uL (ref 0.00–0.07)
Basophils Absolute: 0 10*3/uL (ref 0.0–0.1)
Basophils Relative: 0 %
Eosinophils Absolute: 0 10*3/uL (ref 0.0–0.5)
Eosinophils Relative: 0 %
HCT: 38.3 % (ref 36.0–46.0)
Hemoglobin: 13.3 g/dL (ref 12.0–15.0)
Immature Granulocytes: 0 %
Lymphocytes Relative: 24 %
Lymphs Abs: 1.9 10*3/uL (ref 0.7–4.0)
MCH: 28.1 pg (ref 26.0–34.0)
MCHC: 34.7 g/dL (ref 30.0–36.0)
MCV: 81 fL (ref 80.0–100.0)
Monocytes Absolute: 0.7 10*3/uL (ref 0.1–1.0)
Monocytes Relative: 8 %
Neutro Abs: 5.5 10*3/uL (ref 1.7–7.7)
Neutrophils Relative %: 68 %
Platelets: 321 10*3/uL (ref 150–400)
RBC: 4.73 MIL/uL (ref 3.87–5.11)
RDW: 15 % (ref 11.5–15.5)
WBC: 8.2 10*3/uL (ref 4.0–10.5)
nRBC: 0 % (ref 0.0–0.2)

## 2022-01-01 LAB — PREGNANCY, URINE: Preg Test, Ur: NEGATIVE

## 2022-01-01 LAB — LIPASE, BLOOD: Lipase: 38 U/L (ref 11–51)

## 2022-01-01 MED ORDER — ONDANSETRON HCL 4 MG PO TABS: 4.0000 mg | ORAL_TABLET | Freq: Four times a day (QID) | ORAL | 0 refills | Status: DC

## 2022-01-01 MED ORDER — HYDROCODONE-ACETAMINOPHEN 5-325 MG PO TABS
1.0000 | ORAL_TABLET | ORAL | 0 refills | Status: DC | PRN
Start: 2022-01-01 — End: 2022-01-23

## 2022-01-01 NOTE — ED Triage Notes (Incomplete)
C/o cont right upper abd  pain

## 2022-01-01 NOTE — ED Provider Notes (Signed)
[facility] EMERGENCY DEPARTMENT Provider Note   CSN: [id number] Arrival date & time: 01/01/22  1028     History  Chief Complaint  Patient presents with   Abdominal Pain    [name] is a 31 y.o. female presenting for evaluation of intermittent right upper quadrant pain which has escalated over the past 5 days.  She describes sharp pain in the right upper quadrant which radiates into her back when severe and is associated with nausea and vomiting.  She denies fevers or chills with these episodes, denies any current diarrhea or constipation, no abdominal distention.  No dysuria.  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  She has found no alleviators for her symptoms, however she is currently almost symptom-free, describing a 1 out of 10 pain.  She states last night it was severe and lasted for several hours.  The history is provided by the patient.      Home Medications Prior to Admission medications   Medication Sig Start Date End Date Taking? Authorizing Provider  HYDROcodone-acetaminophen (NORCO/VICODIN) 5-325 MG tablet Take 1 tablet by mouth every 4 (four) hours as needed. 01/01/22  Yes Jalacia Mattila, Almyra Free, PA-C  ondansetron (ZOFRAN) 4 MG tablet Take 1 tablet (4 mg total) by mouth every 6 (six) hours. 01/01/22  Yes Satoshi Kalas, Almyra Free, PA-C  acetaminophen (TYLENOL) 325 MG tablet Take 2 tablets (650 mg total) by mouth every 4 (four) hours as needed (for pain scale < 4). 10/04/18   Aura Camps, MD  acyclovir (ZOVIRAX) 400 MG tablet Take 1 tablet (400 mg total) by mouth 3 (three) times daily. Patient not taking: No sig reported 10/04/18   Aura Camps, MD      Allergies    Patient has no known allergies.    Review of Systems   Review of Systems  Constitutional:  Negative for  chills and fever.  HENT: Negative.    Eyes: Negative.   Respiratory:  Negative for chest tightness and shortness of breath.   Cardiovascular:  Negative for chest pain.  Gastrointestinal:  Positive for abdominal pain, nausea and vomiting. Negative for constipation and diarrhea.  Genitourinary: Negative.  Negative for dysuria and flank pain.  Musculoskeletal:  Negative for arthralgias, joint swelling and neck pain.  Skin: Negative.  Negative for rash and wound.  Neurological:  Negative for dizziness, weakness, light-headedness, numbness and headaches.  Psychiatric/Behavioral: Negative.    All other systems reviewed and are negative.  Physical Exam Updated Vital Signs BP (!) 131/95 (BP Location: Right Arm)    Pulse 69    Temp 98 F (36.7 C) (Oral)    Resp 15    Ht 5' 5.5" (1.664 m)    Wt 95.8 kg    SpO2 99%    BMI 34.61 kg/m  Physical Exam Vitals and nursing note reviewed.  Constitutional:      Appearance: She is well-developed.  HENT:     Head: Normocephalic and atraumatic.  Eyes:     Conjunctiva/sclera: Conjunctivae normal.  Cardiovascular:     Rate and Rhythm: Normal rate and regular rhythm.     Heart sounds: Normal heart sounds.  Pulmonary:     Effort: Pulmonary effort is normal.     Breath sounds: Normal breath sounds. No wheezing.  Abdominal:  General: Bowel sounds are normal.     Palpations: Abdomen is soft.     Tenderness: There is abdominal tenderness in the right upper quadrant. There is no guarding or rebound. Negative signs include Murphy's sign.     Hernia: No hernia is present.     Comments: Mild tenderness in the right upper quadrant without guarding or rebound.  Negative Murphy sign.  Musculoskeletal:        General: Normal range of motion.     Cervical back: Normal range of motion.  Skin:    General: Skin is warm and dry.  Neurological:     Mental Status: She is alert.    ED Results / Procedures / Treatments   Labs (all labs ordered are listed, but  only abnormal results are displayed) Labs Reviewed  COMPREHENSIVE METABOLIC PANEL - Abnormal; Notable for the following components:      Result Value   Glucose, Bld 104 (*)    AST 53 (*)    ALT 57 (*)    All other components within normal limits  URINALYSIS, ROUTINE W REFLEX MICROSCOPIC - Abnormal; Notable for the following components:   APPearance HAZY (*)    Hgb urine dipstick MODERATE (*)    Ketones, ur 5 (*)    Leukocytes,Ua TRACE (*)    Bacteria, UA FEW (*)    All other components within normal limits  LIPASE, BLOOD  CBC WITH DIFFERENTIAL/PLATELET  PREGNANCY, URINE    EKG None  Radiology US Abdomen Limited RUQ (LIVER/GB)  Result Date: 01/01/2022 CLINICAL DATA:  Pain right upper quadrant, nausea, vomiting EXAM: ULTRASOUND ABDOMEN LIMITED RIGHT UPPER QUADRANT COMPARISON:  None. FINDINGS: Gallbladder: There is layering of multiple small gallbladder stones in the dependent portion of gallbladder. Gallbladder is not distended. There is wall thickening in gallbladder. Technologist observed tenderness over the gallbladder. There is possible minimal amount of fluid in the pericholecystic region. Common bile duct: Diameter: 2.3 mm Liver: There is 2.4 x 2.8 x 2.9 cm hyperechoic lesion in the right lobe in the posterior aspect, possibly hemangioma. There is slightly increased echogenicity in rest of the liver. Portal vein is patent on color Doppler imaging with normal direction of blood flow towards the liver. Other: None. IMPRESSION: Gallbladder stones. Gallbladder is not distended. There is wall thickening in gallbladder. Technologist observed tenderness over the gallbladder. Possibility of acute cholecystitis is not excluded. Please correlate with clinical and laboratory findings. There is no significant dilation of bile ducts. There is 2.9 cm hyperechoic lesion in the posterior aspect of the right lobe, possibly hemangioma. Electronically Signed   By: Elmer Picker M.D.   On: 01/01/2022  12:46    Procedures Procedures    Medications Ordered in ED Medications - No data to display  ED Course/ Medical Decision Making/ A&P                           Medical Decision Making Patient with intermittent right upper quadrant pain concerning for possible gallbladder disease.  Currently nearly symptom-free with a severe attack occurring last night.  Amount and/or Complexity of Data Reviewed Labs: ordered. Decision-making details documented in ED Course.    Details: Labs reviewed and do indicate a slight elevation in her AST and ALT at 53 and 57 respectively.  She has a normal WBC count.  Normal lipase. Radiology: ordered.    Details: Ultrasound obtained with findings of gallbladder stones, there is no gallbladder distention but there is  wall thickening in the gallbladder.  There is no dilation of the bile ducts. Discussion of management or test interpretation with external provider(s): Discussed lab findings, ultrasound findings and patient's history with general surgeon Dr. Okey Dupre who will see pt in close f/u, no indication for acute admission today.  Strict return precautions however, avoid fat intake.    Risk Prescription drug management. Decision regarding hospitalization. Risk Details: Patient is stable for discharge home, she was prescribed pain and nausea medicine.  She was also given strict return precautions for any pain that does not respond to pain medication or if she develops uncontrolled vomiting or very importantly if she develops fever.  Patient is aware of these return precautions.  She will call the surgeon for close office follow-up.  Discussed reading labels to avoid fat consumption until seen by general surgery.           Final Clinical Impression(s) / ED Diagnoses Final diagnoses:  RUQ abdominal pain  Gallstones    Rx / DC Orders ED Discharge Orders          Ordered    HYDROcodone-acetaminophen (NORCO/VICODIN) 5-325 MG tablet  Every 4 hours  PRN        01/01/22 1353    ondansetron (ZOFRAN) 4 MG tablet  Every 6 hours        01/01/22 1353              Evalee Jefferson, PA-C 01/01/22 1458    Truddie Hidden, MD 01/01/22 [phone]

## 2022-01-01 NOTE — Discharge Instructions (Signed)
Call the surgeon listed below to schedule an appointment with her next week.  In the interim you have been prescribed pain and nausea medicine in case you have a return of your symptoms.  Keep a close watch on the fat intake in the foods you choose as high-fat foods will cause a gallbladder attack.  If you develop uncontrolled pain or vomiting or if you develop a fever return here immediately for repeat evaluation.

## 2022-01-01 NOTE — ED Triage Notes (Signed)
States she wants to get her gallbladder check out, referral from an urgent care, symptoms intermittent for a couple of months

## 2022-01-06 ENCOUNTER — Other Ambulatory Visit: Payer: Self-pay

## 2022-01-06 ENCOUNTER — Ambulatory Visit (INDEPENDENT_AMBULATORY_CARE_PROVIDER_SITE_OTHER): Payer: 59 | Admitting: Surgery

## 2022-01-06 ENCOUNTER — Encounter: Payer: Self-pay | Admitting: Surgery

## 2022-01-06 VITALS — BP 112/76 | HR 71 | Temp 97.9°F | Resp 14 | Ht 66.0 in | Wt 215.0 lb

## 2022-01-06 DIAGNOSIS — K8020 Calculus of gallbladder without cholecystitis without obstruction: Secondary | ICD-10-CM

## 2022-01-06 NOTE — H&P (Signed)
[facility] History and Physical   Reason for Referral: Cholelithiasis Referring Physician: Evalee Jefferson, PA-C   Chief Complaint   New Patient (Initial Visit)        [name] is a 31 y.o. female.  HPI: Patient presents for evaluation of cholelithiasis.  She states that she has had abdominal pain for a few months, but previously it was only bothering her about once a month.  At those times she would have nausea and if she vomited, her abdominal pain improved.  In the last 1 to 2 weeks, she has had multiple episodes of abdominal pain with nausea and emesis.  Her abdominal pain did not improve after her episodes of emesis.  She initially believed her pain was postprandial, but then she would develop the pain virtually whenever within the last couple of weeks.  She denies fever and chills.  She states that she is unsure what is causing her pain now, as she states that she has been eating a more healthy diet.  She was evaluated in the ED on 1/26, and at that time she had normal blood work and an abdominal ultrasound that demonstrated cholelithiasis and some mild wall thickening.  At that time, her abdominal pain had completely resolved, so was recommended that she follow-up with me in clinic.  Her surgical history significant for a C-section.  She denies use of any blood thinning medications.  She denies history of smoking, alcohol use, and illicit drug use.         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    Supervision of high risk pregnancy in first trimester 10/16/2013    On lovenox 120 mg daily in 1 dose   Vaginal Pap smear, abnormal             Past Surgical History:  Procedure Laterality Date   CESAREAN SECTION N/A 10/02/2018    Procedure: CESAREAN SECTION;  Surgeon: Florian Buff, MD;  Location: [facility];  Service: Obstetrics;  Laterality: N/A;   INDUCED ABORTION   2012,2013   INDUCED ABORTION   02/08/16           Family History  Problem Relation Age of Onset   Diabetes Maternal Grandmother     Hypertension Maternal Grandmother     CAD Maternal Grandmother        Social History         Tobacco Use   Smoking status: Never   Smokeless tobacco: Never  Vaping Use   Vaping Use: Never used  Substance Use Topics   Alcohol use: Not Currently      Comment: wine occ   Drug use: No      Medications: I have reviewed the patient's current medications. Allergies as of 01/06/2022   No Known Allergies         Medication List  Accurate as of January 06, 2022 10:10 AM. If you have any questions, ask your nurse or doctor.              acetaminophen 325 MG tablet Commonly known as: Tylenol Take 2 tablets (650 mg total) by mouth every 4 (four) hours as needed (for pain scale < 4).    acyclovir 400 MG tablet Commonly known as: ZOVIRAX Take 1 tablet (400 mg total) by mouth 3 (three) times daily.    HYDROcodone-acetaminophen 5-325 MG tablet Commonly known as: NORCO/VICODIN Take 1 tablet by mouth every 4 (four) hours as needed.    ondansetron 4 MG tablet Commonly known as: ZOFRAN Take 1 tablet (4 mg total) by mouth every 6 (six) hours.               ROS:  Constitutional: negative for fatigue, fevers, and malaise Eyes: negative for visual disturbance and pain Ears, nose, mouth, throat, and face: negative for ear drainage and sore throat Respiratory: negative for cough, wheezing, and shortness of  breath Cardiovascular: negative for chest pain and palpitations Gastrointestinal: negative for abdominal pain, nausea, reflux symptoms, and vomiting Genitourinary:negative for dysuria, frequency, and urinary retention Integument/breast: negative for dryness and rash Hematologic/lymphatic: negative for bleeding and lymphadenopathy Musculoskeletal:negative for back pain, neck pain, and joint pain Neurological: negative for dizziness, tremors, and numbness Endocrine: negative for temperature intolerance   Blood pressure 112/76, pulse 71, temperature 97.9 F (36.6 C), temperature source Other (Comment), resp. rate 14, height 5\' 6"  (1.676 m), weight 215 lb (97.5 kg), SpO2 98 %. Physical Exam Vitals reviewed.  Constitutional:      Appearance: Normal appearance.  HENT:     Head: Normocephalic and atraumatic.  Eyes:     Extraocular Movements: Extraocular movements intact.     Pupils: Pupils are equal, round, and reactive to light.  Cardiovascular:     Rate and Rhythm: Normal rate and regular rhythm.  Pulmonary:     Effort: Pulmonary effort is normal.     Breath sounds: Normal breath sounds.  Abdominal:     Comments: Soft, nondistended, no percussion tenderness, nontender to palpation, negative Murphy's, no rigidity, guarding, rebound tenderness  Musculoskeletal:        General: Normal range of motion.     Cervical back: Normal range of motion.  Skin:    General: Skin is warm and dry.  Neurological:     General: No focal deficit present.     Mental Status: She is alert and oriented to person, place, and time.  Psychiatric:        Mood and Affect: Mood normal.        Behavior: Behavior normal.      Results: Abdominal ultrasound (1/26): IMPRESSION: Gallbladder stones. Gallbladder is not distended. There is wall thickening in gallbladder. Technologist observed tenderness over the gallbladder. Possibility of acute cholecystitis is not excluded. Please correlate with clinical and  laboratory findings. There is no significant dilation of bile ducts.   There is 2.9 cm hyperechoic lesion in the posterior aspect of the right lobe, possibly hemangioma.   Assessment & Plan:  [name] is a 31 y.o. female who presents with abdominal pain, abdominal ultrasound demonstrating cholelithiasis   -Imaging evaluated by myself, noted cholelithiasis with mild wall thickening -Upon further discussion with the patient about her healthy diet, it was further explained that any fatty foods (even healthy fats) can exacerbate her gallbladder issues.  She states that she has been consuming lots of dairy products, oils,  and avocado.  It was discussed that these foods can precipitate gallbladder attacks -I counseled the patient about the indication, risks and benefits of laparoscopic cholecystectomy.  She understands there is a very small chance for bleeding, infection, injury to normal structures (including common bile duct), conversion to open surgery, persistent symptoms, evolution of postcholecystectomy diarrhea, need for secondary interventions, anesthesia reaction, cardiopulmonary issues and other risks not specifically detailed here. I described the expected recovery, the plan for follow-up and the restrictions during the recovery phase.  All questions were answered. -Patient currently scheduled for laparoscopic cholecystectomy on February 8 -Advised her to watch her diet between now and her surgery, to verify she is consuming a low-fat diet   All questions were answered to the satisfaction of the patient.     Graciella Freer, DO [facility] [address] [place], [place] [address] [phone] (office)

## 2022-01-06 NOTE — Progress Notes (Signed)
[facility] History and Physical  Reason for Referral: Cholelithiasis Referring Physician: Evalee Jefferson, PA-C  Chief Complaint   New Patient (Initial Visit)     [name] is a 31 y.o. female.  HPI: Patient presents for evaluation of cholelithiasis.  She states that she has had abdominal pain for a few months, but previously it was only bothering her about once a month.  At those times she would have nausea and if she vomited, her abdominal pain improved.  In the last 1 to 2 weeks, she has had multiple episodes of abdominal pain with nausea and emesis.  Her abdominal pain did not improve after her episodes of emesis.  She initially believed her pain was postprandial, but then she would develop the pain virtually whenever within the last couple of weeks.  She denies fever and chills.  She states that she is unsure what is causing her pain now, as she states that she has been eating a more healthy diet.  She was evaluated in the ED on 1/26, and at that time she had normal blood work and an abdominal ultrasound that demonstrated cholelithiasis and some mild wall thickening.  At that time, her abdominal pain had completely resolved, so was recommended that she follow-up with me in clinic.  Her surgical history significant for a C-section.  She denies use of any blood thinning medications.  She denies history of smoking, alcohol use, and illicit drug use.   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    Supervision of high risk pregnancy in first trimester 10/16/2013   On lovenox 120 mg daily in 1 dose   Vaginal Pap smear, abnormal     Past Surgical History:  Procedure Laterality Date   CESAREAN SECTION N/A 10/02/2018   Procedure: CESAREAN SECTION;  Surgeon: Florian Buff, MD;  Location: [facility];  Service: Obstetrics;  Laterality: N/A;   INDUCED ABORTION  2012,2013   INDUCED ABORTION  02/08/16    Family History  Problem Relation Age of Onset   Diabetes Maternal Grandmother    Hypertension Maternal Grandmother    CAD Maternal Grandmother     Social History   Tobacco Use   Smoking status: Never   Smokeless tobacco: Never  Vaping Use   Vaping Use: Never used  Substance Use Topics   Alcohol use: Not Currently    Comment: wine occ   Drug use: No    Medications: I have reviewed the patient's current medications. Allergies as of 01/06/2022   No Known Allergies      Medication List        Accurate as of January 06, 2022 10:10 AM. If you have any questions, ask your nurse or doctor.          acetaminophen 325 MG tablet Commonly known as: Tylenol Take 2 tablets (650 mg total) by mouth every 4 (four) hours as needed (for pain scale <  4).   acyclovir 400 MG tablet Commonly known as: ZOVIRAX Take 1 tablet (400 mg total) by mouth 3 (three) times daily.   HYDROcodone-acetaminophen 5-325 MG tablet Commonly known as: NORCO/VICODIN Take 1 tablet by mouth every 4 (four) hours as needed.   ondansetron 4 MG tablet Commonly known as: ZOFRAN Take 1 tablet (4 mg total) by mouth every 6 (six) hours.         ROS:  Constitutional: negative for fatigue, fevers, and malaise Eyes: negative for visual disturbance and pain Ears, nose, mouth, throat, and face: negative for ear drainage and sore throat Respiratory: negative for cough, wheezing, and shortness of breath Cardiovascular: negative for chest pain and palpitations Gastrointestinal: negative  for abdominal pain, nausea, reflux symptoms, and vomiting Genitourinary:negative for dysuria, frequency, and urinary retention Integument/breast: negative for dryness and rash Hematologic/lymphatic: negative for bleeding and lymphadenopathy Musculoskeletal:negative for back pain, neck pain, and joint pain Neurological: negative for dizziness, tremors, and numbness Endocrine: negative for temperature intolerance  Blood pressure 112/76, pulse 71, temperature 97.9 F (36.6 C), temperature source Other (Comment), resp. rate 14, height 5\' 6"  (1.676 m), weight 215 lb (97.5 kg), SpO2 98 %. Physical Exam Vitals reviewed.  Constitutional:      Appearance: Normal appearance.  HENT:     Head: Normocephalic and atraumatic.  Eyes:     Extraocular Movements: Extraocular movements intact.     Pupils: Pupils are equal, round, and reactive to light.  Cardiovascular:     Rate and Rhythm: Normal rate and regular rhythm.  Pulmonary:     Effort: Pulmonary effort is normal.     Breath sounds: Normal breath sounds.  Abdominal:     Comments: Soft, nondistended, no percussion tenderness, nontender to palpation, negative Murphy's, no rigidity, guarding, rebound tenderness  Musculoskeletal:        General: Normal range of motion.     Cervical back: Normal range of motion.  Skin:    General: Skin is warm and dry.  Neurological:     General: No focal deficit present.     Mental Status: She is alert and oriented to person, place, and time.  Psychiatric:        Mood and Affect: Mood normal.        Behavior: Behavior normal.    Results: Abdominal ultrasound (1/26): IMPRESSION: Gallbladder stones. Gallbladder is not distended. There is wall thickening in gallbladder. Technologist observed tenderness over the gallbladder. Possibility of acute cholecystitis is not excluded. Please correlate with clinical and laboratory findings. There is no significant dilation of bile ducts.   There is 2.9 cm  hyperechoic lesion in the posterior aspect of the right lobe, possibly hemangioma.  Assessment & Plan:  [name] is a 31 y.o. female who presents with abdominal pain, abdominal ultrasound demonstrating cholelithiasis  -Imaging evaluated by myself, noted cholelithiasis with mild wall thickening -Upon further discussion with the patient about her healthy diet, it was further explained that any fatty foods (even healthy fats) can exacerbate her gallbladder issues.  She states that she has been consuming lots of dairy products, oils, and avocado.  It was discussed that these foods can precipitate gallbladder attacks -I counseled the patient about the indication, risks and benefits of laparoscopic cholecystectomy.  She understands there is a very small chance for bleeding, infection, injury to normal structures (including common bile duct), conversion to open surgery, persistent symptoms, evolution of postcholecystectomy diarrhea, need for secondary interventions, anesthesia reaction, cardiopulmonary issues and other risks not specifically detailed here.  I described the expected recovery, the plan for follow-up and the restrictions during the recovery phase.  All questions were answered. -Patient currently scheduled for laparoscopic cholecystectomy on February 8 -Advised her to watch her diet between now and her surgery, to verify she is consuming a low-fat diet  All questions were answered to the satisfaction of the patient.   Graciella Freer, DO [facility] [address] [place], [place] [address] [phone] (office)

## 2022-01-09 NOTE — Patient Instructions (Signed)
[name]  01/09/2022     @PREFPERIOPPHARMACY @   Your procedure is scheduled on  01/14/2022.   Report to [facility] at  0930  A.M.   Call this number if you have problems the morning of surgery:  [phone]   Remember:  Do not eat or drink after midnight.      Take these medicines the morning of surgery with A SIP OF WATER                 Hydrocodone (if needed), zofran(if needed).     Do not wear jewelry, make-up or nail polish.  Do not wear lotions, powders, or perfumes, or deodorant.  Do not shave 48 hours prior to surgery.  Men may shave face and neck.  Do not bring valuables to the hospital.  [facility] is not responsible for any belongings or valuables.  Contacts, dentures or bridgework may not be worn into surgery.  Leave your suitcase in the car.  After surgery it may be brought to your room.  For patients admitted to the hospital, discharge time will be determined by your treatment team.  Patients discharged the day of surgery will not be allowed to drive home and must have someone with them for 24 hours.    Special instructions:   DO NOT smoke tobacco or vape for 24 hours before your procedure.  Please read over the following fact sheets that you were given. Coughing and Deep Breathing, Surgical Site Infection Prevention, Anesthesia Post-op Instructions, and Care and Recovery After Surgery      Minimally Invasive Cholecystectomy, Care After The following information offers guidance on how to care for yourself after your procedure. Your health care provider may also give you more specific instructions. If you have problems or questions, contact your health care provider. What can I expect after the procedure? After the procedure, it is common to have: Pain at your incision sites. You will be given medicines to control this pain. Mild nausea or vomiting. Bloating and possible shoulder pain from the gas that was used during the  procedure. Follow these instructions at home: Medicines Take over-the-counter and prescription medicines only as told by your health care provider. If you were prescribed an antibiotic medicine, take it as told by your health care provider. Do not stop using the antibiotic even if you start to feel better. Ask your health care provider if the medicine prescribed to you: Requires you to avoid driving or using machinery. Can cause constipation. You may need to take these actions to prevent or treat constipation: Drink enough fluid to keep your urine pale yellow. Take over-the-counter or prescription medicines. Eat foods that are high in fiber, such as beans, whole grains, and fresh fruits and vegetables. Limit foods that are high in fat and processed sugars, such as fried or sweet foods. Incision care  Follow instructions from your health care provider about how to take care of your incisions. Make sure you: Wash your hands with soap and water for at least 20 seconds before and after you change your bandage (dressing). If soap and water are not available, use hand sanitizer. Change your dressing as told by your health care provider. Leave stitches (sutures), skin glue, or adhesive strips in place. These skin closures may need to be in place for 2 weeks or longer. If adhesive strip edges start to loosen and curl up, you may trim the loose edges. Do not remove adhesive  strips completely unless your health care provider tells you to do that. Do not take baths, swim, or use a hot tub until your health care provider approves. Ask your health care provider if you may take showers. You may only be allowed to take sponge baths. Check your incision area every day for signs of infection. Check for: More redness, swelling, or pain. Fluid or blood. Warmth. Pus or a bad smell. Activity Rest as told by your health care provider. Do not do activities that require a lot of effort. Avoid sitting for a long  time without moving. Get up to take short walks every 1-2 hours. This is important to improve blood flow and breathing. Ask for help if you feel weak or unsteady. Do not lift anything that is heavier than 10 lb (4.5 kg), or the limit that you are told, until your health care provider says that it is safe. Do not play contact sports until your health care provider approves. Do not return to work or school until your health care provider approves. Return to your normal activities as told by your health care provider. Ask your health care provider what activities are safe for you. General instructions If you were given a sedative during the procedure, it can affect you for several hours. Do not drive or operate machinery until your health care provider says that it is safe. Keep all follow-up visits. This is important. Contact a health care provider if: You develop a rash. You have more redness, swelling, or pain around your incisions. You have fluid or blood coming from your incisions. Your incisions feel warm to the touch. You have pus or a bad smell coming from your incisions. You have a fever. One or more of your incisions breaks open. Get help right away if: You have trouble breathing. You have chest pain. You have more pain in your shoulders. You faint or feel dizzy when you stand. You have severe pain in your abdomen. You have nausea or vomiting that lasts for more than one day. You have leg pain that is new or unusual, or if it is localized to one specific spot. These symptoms may represent a serious problem that is an emergency. Do not wait to see if the symptoms will go away. Get medical help right away. Call your local emergency services (911 in the [place] Do not drive yourself to the hospital. Summary After your procedure, it is common to have pain at the incision sites. You may also have nausea or bloating. Follow your health care provider's instructions about medicine, activity  restrictions, and caring for your incision areas. Do not do activities that require a lot of effort. Contact a health care provider if you have a fever or other signs of infection, such as more redness, swelling, or pain around the incisions. Get help right away if you have chest pain, increasing pain in the shoulders, or trouble breathing. This information is not intended to replace advice given to you by your health care provider. Make sure you discuss any questions you have with your health care provider. Document Revised: 05/27/2021 Document Reviewed: 05/27/2021 Elsevier Patient Education  2022 [employer] General Anesthesia, Adult, Care After This sheet gives you information about how to care for yourself after your procedure. Your health care provider may also give you more specific instructions. If you have problems or questions, contact your health care provider. What can I expect after the procedure? After the procedure, the following side effects are  common: Pain or discomfort at the IV site. Nausea. Vomiting. Sore throat. Trouble concentrating. Feeling cold or chills. Feeling weak or tired. Sleepiness and fatigue. Soreness and body aches. These side effects can affect parts of the body that were not involved in surgery. Follow these instructions at home: For the time period you were told by your health care provider:  Rest. Do not participate in activities where you could fall or become injured. Do not drive or use machinery. Do not drink alcohol. Do not take sleeping pills or medicines that cause drowsiness. Do not make important decisions or sign legal documents. Do not take care of children on your own. Eating and drinking Follow any instructions from your health care provider about eating or drinking restrictions. When you feel hungry, start by eating small amounts of foods that are soft and easy to digest (bland), such as toast. Gradually return to your regular  diet. Drink enough fluid to keep your urine pale yellow. If you vomit, rehydrate by drinking water, juice, or clear broth. General instructions If you have sleep apnea, surgery and certain medicines can increase your risk for breathing problems. Follow instructions from your health care provider about wearing your sleep device: Anytime you are sleeping, including during daytime naps. While taking prescription pain medicines, sleeping medicines, or medicines that make you drowsy. Have a responsible adult stay with you for the time you are told. It is important to have someone help care for you until you are awake and alert. Return to your normal activities as told by your health care provider. Ask your health care provider what activities are safe for you. Take over-the-counter and prescription medicines only as told by your health care provider. If you smoke, do not smoke without supervision. Keep all follow-up visits as told by your health care provider. This is important. Contact a health care provider if: You have nausea or vomiting that does not get better with medicine. You cannot eat or drink without vomiting. You have pain that does not get better with medicine. You are unable to pass urine. You develop a skin rash. You have a fever. You have redness around your IV site that gets worse. Get help right away if: You have difficulty breathing. You have chest pain. You have blood in your urine or stool, or you vomit blood. Summary After the procedure, it is common to have a sore throat or nausea. It is also common to feel tired. Have a responsible adult stay with you for the time you are told. It is important to have someone help care for you until you are awake and alert. When you feel hungry, start by eating small amounts of foods that are soft and easy to digest (bland), such as toast. Gradually return to your regular diet. Drink enough fluid to keep your urine pale yellow. Return  to your normal activities as told by your health care provider. Ask your health care provider what activities are safe for you. This information is not intended to replace advice given to you by your health care provider. Make sure you discuss any questions you have with your health care provider. Document Revised: 08/08/2020 Document Reviewed: 03/07/2020 Elsevier Patient Education  2022 [employer] How to Use Chlorhexidine for Bathing Chlorhexidine gluconate (CHG) is a germ-killing (antiseptic) solution that is used to clean the skin. It can get rid of the bacteria that normally live on the skin and can keep them away for about 24 hours. To clean your  skin with CHG, you may be given: A CHG solution to use in the shower or as part of a sponge bath. A prepackaged cloth that contains CHG. Cleaning your skin with CHG may help lower the risk for infection: While you are staying in the intensive care unit of the hospital. If you have a vascular access, such as a central line, to provide short-term or long-term access to your veins. If you have a catheter to drain urine from your bladder. If you are on a ventilator. A ventilator is a machine that helps you breathe by moving air in and out of your lungs. After surgery. What are the risks? Risks of using CHG include: A skin reaction. Hearing loss, if CHG gets in your ears and you have a perforated eardrum. Eye injury, if CHG gets in your eyes and is not rinsed out. The CHG product catching fire. Make sure that you avoid smoking and flames after applying CHG to your skin. Do not use CHG: If you have a chlorhexidine allergy or have previously reacted to chlorhexidine. On babies younger than 532 months of age. How to use CHG solution Use CHG only as told by your health care provider, and follow the instructions on the label. Use the full amount of CHG as directed. Usually, this is one bottle. During a shower Follow these steps when using CHG  solution during a shower (unless your health care provider gives you different instructions): Start the shower. Use your normal soap and shampoo to wash your face and hair. Turn off the shower or move out of the shower stream. Pour the CHG onto a clean washcloth. Do not use any type of brush or rough-edged sponge. Starting at your neck, lather your body down to your toes. Make sure you follow these instructions: If you will be having surgery, pay special attention to the part of your body where you will be having surgery. Scrub this area for at least 1 minute. Do not use CHG on your head or face. If the solution gets into your ears or eyes, rinse them well with water. Avoid your genital area. Avoid any areas of skin that have broken skin, cuts, or scrapes. Scrub your back and under your arms. Make sure to wash skin folds. Let the lather sit on your skin for 1-2 minutes or as long as told by your health care provider. Thoroughly rinse your entire body in the shower. Make sure that all body creases and crevices are rinsed well. Dry off with a clean towel. Do not put any substances on your body afterward--such as powder, lotion, or perfume--unless you are told to do so by your health care provider. Only use lotions that are recommended by the manufacturer. Put on clean clothes or pajamas. If it is the night before your surgery, sleep in clean sheets.  During a sponge bath Follow these steps when using CHG solution during a sponge bath (unless your health care provider gives you different instructions): Use your normal soap and shampoo to wash your face and hair. Pour the CHG onto a clean washcloth. Starting at your neck, lather your body down to your toes. Make sure you follow these instructions: If you will be having surgery, pay special attention to the part of your body where you will be having surgery. Scrub this area for at least 1 minute. Do not use CHG on your head or face. If the solution  gets into your ears or eyes, rinse them well with  water. Avoid your genital area. Avoid any areas of skin that have broken skin, cuts, or scrapes. Scrub your back and under your arms. Make sure to wash skin folds. Let the lather sit on your skin for 1-2 minutes or as long as told by your health care provider. Using a different clean, wet washcloth, thoroughly rinse your entire body. Make sure that all body creases and crevices are rinsed well. Dry off with a clean towel. Do not put any substances on your body afterward--such as powder, lotion, or perfume--unless you are told to do so by your health care provider. Only use lotions that are recommended by the manufacturer. Put on clean clothes or pajamas. If it is the night before your surgery, sleep in clean sheets. How to use CHG prepackaged cloths Only use CHG cloths as told by your health care provider, and follow the instructions on the label. Use the CHG cloth on clean, dry skin. Do not use the CHG cloth on your head or face unless your health care provider tells you to. When washing with the CHG cloth: Avoid your genital area. Avoid any areas of skin that have broken skin, cuts, or scrapes. Before surgery Follow these steps when using a CHG cloth to clean before surgery (unless your health care provider gives you different instructions): Using the CHG cloth, vigorously scrub the part of your body where you will be having surgery. Scrub using a back-and-forth motion for 3 minutes. The area on your body should be completely wet with CHG when you are done scrubbing. Do not rinse. Discard the cloth and let the area air-dry. Do not put any substances on the area afterward, such as powder, lotion, or perfume. Put on clean clothes or pajamas. If it is the night before your surgery, sleep in clean sheets.  For general bathing Follow these steps when using CHG cloths for general bathing (unless your health care provider gives you different  instructions). Use a separate CHG cloth for each area of your body. Make sure you wash between any folds of skin and between your fingers and toes. Wash your body in the following order, switching to a new cloth after each step: The front of your neck, shoulders, and chest. Both of your arms, under your arms, and your hands. Your stomach and groin area, avoiding the genitals. Your right leg and foot. Your left leg and foot. The back of your neck, your back, and your buttocks. Do not rinse. Discard the cloth and let the area air-dry. Do not put any substances on your body afterward--such as powder, lotion, or perfume--unless you are told to do so by your health care provider. Only use lotions that are recommended by the manufacturer. Put on clean clothes or pajamas. Contact a health care provider if: Your skin gets irritated after scrubbing. You have questions about using your solution or cloth. You swallow any chlorhexidine. Call your local poison control center ([phone] in the [place] Get help right away if: Your eyes itch badly, or they become very red or swollen. Your skin itches badly and is red or swollen. Your hearing changes. You have trouble seeing. You have swelling or tingling in your mouth or throat. You have trouble breathing. These symptoms may represent a serious problem that is an emergency. Do not wait to see if the symptoms will go away. Get medical help right away. Call your local emergency services (911 in the [place] Do not drive yourself to the hospital. Summary Chlorhexidine gluconate (  CHG) is a germ-killing (antiseptic) solution that is used to clean the skin. Cleaning your skin with CHG may help to lower your risk for infection. You may be given CHG to use for bathing. It may be in a bottle or in a prepackaged cloth to use on your skin. Carefully follow your health care provider's instructions and the instructions on the product label. Do not use CHG if you have  a chlorhexidine allergy. Contact your health care provider if your skin gets irritated after scrubbing. This information is not intended to replace advice given to you by your health care provider. Make sure you discuss any questions you have with your health care provider. Document Revised: 02/03/2021 Document Reviewed: 02/03/2021 Elsevier Patient Education  2022 [employer].

## 2022-01-12 ENCOUNTER — Encounter (HOSPITAL_COMMUNITY): Payer: Self-pay

## 2022-01-12 ENCOUNTER — Encounter (HOSPITAL_COMMUNITY)
Admission: RE | Admit: 2022-01-12 | Discharge: 2022-01-12 | Disposition: A | Discharge status: Home / Self Care | Payer: 59 | Source: Ambulatory Visit | Attending: Surgery | Admitting: Surgery

## 2022-01-12 DIAGNOSIS — Z01818 Encounter for other preprocedural examination: Secondary | ICD-10-CM

## 2022-01-20 NOTE — Patient Instructions (Signed)
[name]  01/20/2022     @PREFPERIOPPHARMACY @   Your procedure is scheduled on  01/23/2022.   [facility]  [phone] A.M.   Call this number if you have problems the morning of surgery:  [phone]   Remember:  Do not eat or drink after midnight.      Take these medicines the morning of surgery with A SIP OF WATER       hydrocodone(if needed), zofran (if needed).     Do not wear jewelry, make-up or nail polish.  Do not wear lotions, powders, or perfumes, or deodorant.  Do not shave 48 hours prior to surgery.  Men may shave face and neck.  Do not bring valuables to the hospital.  [facility] belongings or valuables.  Contacts, dentures or bridgework may not be worn into surgery.  Leave your suitcase in the car.  After surgery it may be brought to your room.  For patients admitted to the hospital, discharge time will be determined by your treatment team.  Patients discharged the day of surgery will not be allowed to drive home and must have someone with them for 24 hours.    Special instructions:  DO NOT smoke tobacco or vape for 24 hours before your procedure.  Please read over the following fact sheets that you were given. Coughing and Deep Breathing, Surgical Site Infection Prevention, Anesthesia Post-op Instructions, and Care and Recovery After Surgery      Minimally Invasive Cholecystectomy, Care After The following information offers guidance on how to care for yourself after your procedure. Your health care provider may also give you more specific instructions. If you have problems or questions, contact your health care provider. What can I expect after the procedure? After the procedure, it is common to have: Pain at your incision sites. You will be given medicines to control this pain. Mild nausea or vomiting. Bloating and possible shoulder pain from the gas that was used during the procedure. Follow these  instructions at home: Medicines Take over-the-counter and prescription medicines only as told by your health care provider. If you were prescribed an antibiotic medicine, take it as told by your health care provider. Do not stop using the antibiotic even if you start to feel better. Ask your health care provider if the medicine prescribed to you: Requires you to avoid driving or using machinery. Can cause constipation. You may need to take these actions to prevent or treat constipation: Drink enough fluid to keep your urine pale yellow. Take over-the-counter or prescription medicines. Eat foods that are high in fiber, such as beans, whole grains, and fresh fruits and vegetables. Limit foods that are high in fat and processed sugars, such as fried or sweet foods. Incision care  Follow instructions from your health care provider about how to take care of your incisions. Make sure you: Wash your hands with soap and water for at least 20 seconds before and after you change your bandage (dressing). If soap and water are not available, use hand sanitizer. Change your dressing as told by your health care provider. Leave stitches (sutures), skin glue, or adhesive strips in place. These skin closures may need to be in place for 2 weeks or longer. If adhesive strip edges start to loosen and curl up, you may trim the loose edges. Do not remove adhesive strips completely unless your health care provider tells you to do  that. Do not take baths, swim, or use a hot tub until your health care provider approves. Ask your health care provider if you may take showers. You may only be allowed to take sponge baths. Check your incision area every day for signs of infection. Check for: More redness, swelling, or pain. Fluid or blood. Warmth. Pus or a bad smell. Activity Rest as told by your health care provider. Do not do activities that require a lot of effort. Avoid sitting for a long time without moving. Get up  to take short walks every 1-2 hours. This is important to improve blood flow and breathing. Ask for help if you feel weak or unsteady. Do not lift anything that is heavier than 10 lb (4.5 kg), or the limit that you are told, until your health care provider says that it is safe. Do not play contact sports until your health care provider approves. Do not return to work or school until your health care provider approves. Return to your normal activities as told by your health care provider. Ask your health care provider what activities are safe for you. General instructions If you were given a sedative during the procedure, it can affect you for several hours. Do not drive or operate machinery until your health care provider says that it is safe. Keep all follow-up visits. This is important. Contact a health care provider if: You develop a rash. You have more redness, swelling, or pain around your incisions. You have fluid or blood coming from your incisions. Your incisions feel warm to the touch. You have pus or a bad smell coming from your incisions. You have a fever. One or more of your incisions breaks open. Get help right away if: You have trouble breathing. You have chest pain. You have more pain in your shoulders. You faint or feel dizzy when you stand. You have severe pain in your abdomen. You have nausea or vomiting that lasts for more than one day. You have leg pain that is new or unusual, or if it is localized to one specific spot. These symptoms may represent a serious problem that is an emergency. Do not wait to see if the symptoms will go away. Get medical help right away. Call your local emergency services (911 in the [place] Do not drive yourself to the hospital. Summary After your procedure, it is common to have pain at the incision sites. You may also have nausea or bloating. Follow your health care provider's instructions about medicine, activity restrictions, and caring for  your incision areas. Do not do activities that require a lot of effort. Contact a health care provider if you have a fever or other signs of infection, such as more redness, swelling, or pain around the incisions. Get help right away if you have chest pain, increasing pain in the shoulders, or trouble breathing. This information is not intended to replace advice given to you by your health care provider. Make sure you discuss any questions you have with your health care provider. Document Revised: 05/27/2021 Document Reviewed: 05/27/2021 Elsevier Patient Education  2022 Elsevier Inc. General Anesthesia, Adult, Care After This sheet gives you information about how to care for yourself after your procedure. Your health care provider may also give you more specific instructions. If you have problems or questions, contact your health care provider. What can I expect after the procedure? After the procedure, the following side effects are common: Pain or discomfort at the IV site. Nausea. Vomiting. Sore  throat. Trouble concentrating. Feeling cold or chills. Feeling weak or tired. Sleepiness and fatigue. Soreness and body aches. These side effects can affect parts of the body that were not involved in surgery. Follow these instructions at home: For the time period you were told by your health care provider:  Rest. Do not participate in activities where you could fall or become injured. Do not drive or use machinery. Do not drink alcohol. Do not take sleeping pills or medicines that cause drowsiness. Do not make important decisions or sign legal documents. Do not take care of children on your own. Eating and drinking Follow any instructions from your health care provider about eating or drinking restrictions. When you feel hungry, start by eating small amounts of foods that are soft and easy to digest (bland), such as toast. Gradually return to your regular diet. Drink enough fluid to keep  your urine pale yellow. If you vomit, rehydrate by drinking water, juice, or clear broth. General instructions If you have sleep apnea, surgery and certain medicines can increase your risk for breathing problems. Follow instructions from your health care provider about wearing your sleep device: Anytime you are sleeping, including during daytime naps. While taking prescription pain medicines, sleeping medicines, or medicines that make you drowsy. Have a responsible adult stay with you for the time you are told. It is important to have someone help care for you until you are awake and alert. Return to your normal activities as told by your health care provider. Ask your health care provider what activities are safe for you. Take over-the-counter and prescription medicines only as told by your health care provider. If you smoke, do not smoke without supervision. Keep all follow-up visits as told by your health care provider. This is important. Contact a health care provider if: You have nausea or vomiting that does not get better with medicine. You cannot eat or drink without vomiting. You have pain that does not get better with medicine. You are unable to pass urine. You develop a skin rash. You have a fever. You have redness around your IV site that gets worse. Get help right away if: You have difficulty breathing. You have chest pain. You have blood in your urine or stool, or you vomit blood. Summary After the procedure, it is common to have a sore throat or nausea. It is also common to feel tired. Have a responsible adult stay with you for the time you are told. It is important to have someone help care for you until you are awake and alert. When you feel hungry, start by eating small amounts of foods that are soft and easy to digest (bland), such as toast. Gradually return to your regular diet. Drink enough fluid to keep your urine pale yellow. Return to your normal activities as told  by your health care provider. Ask your health care provider what activities are safe for you. This information is not intended to replace advice given to you by your health care provider. Make sure you discuss any questions you have with your health care provider. Document Revised: 08/08/2020 Document Reviewed: 03/07/2020 Elsevier Patient Education  2022 Elsevier Inc. How to Use Chlorhexidine for Bathing Chlorhexidine gluconate (CHG) is a germ-killing (antiseptic) solution that is used to clean the skin. It can get rid of the bacteria that normally live on the skin and can keep them away for about 24 hours. To clean your skin with CHG, you may be given: A CHG solution to  use in the shower or as part of a sponge bath. A prepackaged cloth that contains CHG. Cleaning your skin with CHG may help lower the risk for infection: While you are staying in the intensive care unit of the hospital. If you have a vascular access, such as a central line, to provide short-term or long-term access to your veins. If you have a catheter to drain urine from your bladder. If you are on a ventilator. A ventilator is a machine that helps you breathe by moving air in and out of your lungs. After surgery. What are the risks? Risks of using CHG include: A skin reaction. Hearing loss, if CHG gets in your ears and you have a perforated eardrum. Eye injury, if CHG gets in your eyes and is not rinsed out. The CHG product catching fire. Make sure that you avoid smoking and flames after applying CHG to your skin. Do not use CHG: If you have a chlorhexidine allergy or have previously reacted to chlorhexidine. On babies younger than 17 months of age. How to use CHG solution Use CHG only as told by your health care provider, and follow the instructions on the label. Use the full amount of CHG as directed. Usually, this is one bottle. During a shower Follow these steps when using CHG solution during a shower (unless your  health care provider gives you different instructions): Start the shower. Use your normal soap and shampoo to wash your face and hair. Turn off the shower or move out of the shower stream. Pour the CHG onto a clean washcloth. Do not use any type of brush or rough-edged sponge. Starting at your neck, lather your body down to your toes. Make sure you follow these instructions: If you will be having surgery, pay special attention to the part of your body where you will be having surgery. Scrub this area for at least 1 minute. Do not use CHG on your head or face. If the solution gets into your ears or eyes, rinse them well with water. Avoid your genital area. Avoid any areas of skin that have broken skin, cuts, or scrapes. Scrub your back and under your arms. Make sure to wash skin folds. Let the lather sit on your skin for 1-2 minutes or as long as told by your health care provider. Thoroughly rinse your entire body in the shower. Make sure that all body creases and crevices are rinsed well. Dry off with a clean towel. Do not put any substances on your body afterward--such as powder, lotion, or perfume--unless you are told to do so by your health care provider. Only use lotions that are recommended by the manufacturer. Put on clean clothes or pajamas. If it is the night before your surgery, sleep in clean sheets.  During a sponge bath Follow these steps when using CHG solution during a sponge bath (unless your health care provider gives you different instructions): Use your normal soap and shampoo to wash your face and hair. Pour the CHG onto a clean washcloth. Starting at your neck, lather your body down to your toes. Make sure you follow these instructions: If you will be having surgery, pay special attention to the part of your body where you will be having surgery. Scrub this area for at least 1 minute. Do not use CHG on your head or face. If the solution gets into your ears or eyes, rinse  them well with water. Avoid your genital area. Avoid any areas of skin that  have broken skin, cuts, or scrapes. Scrub your back and under your arms. Make sure to wash skin folds. Let the lather sit on your skin for 1-2 minutes or as long as told by your health care provider. Using a different clean, wet washcloth, thoroughly rinse your entire body. Make sure that all body creases and crevices are rinsed well. Dry off with a clean towel. Do not put any substances on your body afterward--such as powder, lotion, or perfume--unless you are told to do so by your health care provider. Only use lotions that are recommended by the manufacturer. Put on clean clothes or pajamas. If it is the night before your surgery, sleep in clean sheets. How to use CHG prepackaged cloths Only use CHG cloths as told by your health care provider, and follow the instructions on the label. Use the CHG cloth on clean, dry skin. Do not use the CHG cloth on your head or face unless your health care provider tells you to. When washing with the CHG cloth: Avoid your genital area. Avoid any areas of skin that have broken skin, cuts, or scrapes. Before surgery Follow these steps when using a CHG cloth to clean before surgery (unless your health care provider gives you different instructions): Using the CHG cloth, vigorously scrub the part of your body where you will be having surgery. Scrub using a back-and-forth motion for 3 minutes. The area on your body should be completely wet with CHG when you are done scrubbing. Do not rinse. Discard the cloth and let the area air-dry. Do not put any substances on the area afterward, such as powder, lotion, or perfume. Put on clean clothes or pajamas. If it is the night before your surgery, sleep in clean sheets.  For general bathing Follow these steps when using CHG cloths for general bathing (unless your health care provider gives you different instructions). Use a separate CHG cloth  for each area of your body. Make sure you wash between any folds of skin and between your fingers and toes. Wash your body in the following order, switching to a new cloth after each step: The front of your neck, shoulders, and chest. Both of your arms, under your arms, and your hands. Your stomach and groin area, avoiding the genitals. Your right leg and foot. Your left leg and foot. The back of your neck, your back, and your buttocks. Do not rinse. Discard the cloth and let the area air-dry. Do not put any substances on your body afterward--such as powder, lotion, or perfume--unless you are told to do so by your health care provider. Only use lotions that are recommended by the manufacturer. Put on clean clothes or pajamas. Contact a health care provider if: Your skin gets irritated after scrubbing. You have questions about using your solution or cloth. You swallow any chlorhexidine. Call your local poison control center [phone])525-5995 in the [place] Get help right away if: Your eyes itch badly, or they become very red or swollen. Your skin itches badly and is red or swollen. Your hearing changes. You have trouble seeing. You have swelling or tingling in your mouth or throat. You have trouble breathing. These symptoms may represent a serious problem that is an emergency. Do not wait to see if the symptoms will go away. Get medical help right away. Call your local emergency services (911 in the [place] Do not drive yourself to the hospital. Summary Chlorhexidine gluconate (CHG) is a germ-killing (antiseptic) solution that is used to clean  the skin. Cleaning your skin with CHG may help to lower your risk for infection. You may be given CHG to use for bathing. It may be in a bottle or in a prepackaged cloth to use on your skin. Carefully follow your health care provider's instructions and the instructions on the product label. Do not use CHG if you have a chlorhexidine allergy. Contact your  health care provider if your skin gets irritated after scrubbing. This information is not intended to replace advice given to you by your health care provider. Make sure you discuss any questions you have with your health care provider. Document Revised: 02/03/2021 Document Reviewed: 02/03/2021 Elsevier Patient Education  2022 [employer].

## 2022-01-21 ENCOUNTER — Encounter (HOSPITAL_COMMUNITY)
Admission: RE | Admit: 2022-01-21 | Discharge: 2022-01-21 | Disposition: A | Discharge status: Home / Self Care | Payer: 59 | Source: Ambulatory Visit | Attending: Surgery | Admitting: Surgery

## 2022-01-21 ENCOUNTER — Encounter (HOSPITAL_COMMUNITY): Payer: Self-pay

## 2022-01-21 DIAGNOSIS — Z01812 Encounter for preprocedural laboratory examination: Secondary | ICD-10-CM | POA: Insufficient documentation

## 2022-01-21 DIAGNOSIS — Z01818 Encounter for other preprocedural examination: Secondary | ICD-10-CM

## 2022-01-21 LAB — PREGNANCY, URINE: Preg Test, Ur: NEGATIVE

## 2022-01-23 ENCOUNTER — Ambulatory Visit (HOSPITAL_BASED_OUTPATIENT_CLINIC_OR_DEPARTMENT_OTHER): Payer: 59 | Admitting: Anesthesiology

## 2022-01-23 ENCOUNTER — Ambulatory Visit (HOSPITAL_COMMUNITY): Payer: 59 | Admitting: Anesthesiology

## 2022-01-23 ENCOUNTER — Encounter (HOSPITAL_COMMUNITY)
Admission: RE | Disposition: A | Discharge status: Home / Self Care | Payer: Self-pay | Source: Home / Self Care | Attending: Surgery

## 2022-01-23 ENCOUNTER — Other Ambulatory Visit: Payer: Self-pay

## 2022-01-23 ENCOUNTER — Encounter (HOSPITAL_COMMUNITY): Payer: Self-pay | Admitting: Surgery

## 2022-01-23 ENCOUNTER — Ambulatory Visit (HOSPITAL_COMMUNITY)
Admission: RE | Admit: 2022-01-23 | Discharge: 2022-01-23 | Disposition: A | Discharge status: Home / Self Care | Payer: 59 | Attending: Surgery | Admitting: Surgery

## 2022-01-23 DIAGNOSIS — K8020 Calculus of gallbladder without cholecystitis without obstruction: Secondary | ICD-10-CM | POA: Diagnosis not present

## 2022-01-23 DIAGNOSIS — K8010 Calculus of gallbladder with chronic cholecystitis without obstruction: Secondary | ICD-10-CM | POA: Insufficient documentation

## 2022-01-23 HISTORY — PX: CHOLECYSTECTOMY: SHX55

## 2022-01-23 SURGERY — LAPAROSCOPIC CHOLECYSTECTOMY
Anesthesia: General | Site: Abdomen

## 2022-01-23 MED ORDER — ORAL CARE MOUTH RINSE: 15.0000 mL | Freq: Once | OROMUCOSAL | Status: DC

## 2022-01-23 MED ORDER — MIDAZOLAM HCL 2 MG/2ML IJ SOLN
INTRAMUSCULAR | Status: AC
  Filled 2022-01-23: qty 2

## 2022-01-23 MED ORDER — SUGAMMADEX SODIUM 200 MG/2ML IV SOLN
INTRAVENOUS | Status: DC | PRN
  Administered 2022-01-23: 200 mg via INTRAVENOUS

## 2022-01-23 MED ORDER — OXYCODONE HCL 5 MG PO TABS: 5.0000 mg | ORAL_TABLET | Freq: Three times a day (TID) | ORAL | 0 refills | Status: DC | PRN

## 2022-01-23 MED ORDER — MIDAZOLAM HCL 5 MG/5ML IJ SOLN
INTRAMUSCULAR | Status: DC | PRN
  Administered 2022-01-23: 2 mg via INTRAVENOUS

## 2022-01-23 MED ORDER — LACTATED RINGERS IV SOLN: INTRAVENOUS | Status: DC

## 2022-01-23 MED ORDER — HEMOSTATIC AGENTS (NO CHARGE) OPTIME
TOPICAL | Status: DC | PRN
  Administered 2022-01-23: 1 via TOPICAL

## 2022-01-23 MED ORDER — MEPERIDINE HCL 50 MG/ML IJ SOLN: 6.2500 mg | INTRAMUSCULAR | Status: DC | PRN

## 2022-01-23 MED ORDER — PROPOFOL 10 MG/ML IV BOLUS
INTRAVENOUS | Status: DC | PRN
  Administered 2022-01-23: 180 mg via INTRAVENOUS

## 2022-01-23 MED ORDER — ONDANSETRON HCL 4 MG/2ML IJ SOLN
INTRAMUSCULAR | Status: DC | PRN
Start: 2022-01-23 — End: 2022-01-23
  Administered 2022-01-23: 4 mg via INTRAVENOUS

## 2022-01-23 MED ORDER — DEXAMETHASONE SODIUM PHOSPHATE 10 MG/ML IJ SOLN
INTRAMUSCULAR | Status: AC
  Filled 2022-01-23: qty 1

## 2022-01-23 MED ORDER — CHLORHEXIDINE GLUCONATE CLOTH 2 % EX PADS: 6.0000 | MEDICATED_PAD | Freq: Once | CUTANEOUS | Status: DC

## 2022-01-23 MED ORDER — HYDROMORPHONE HCL 1 MG/ML IJ SOLN
0.2500 mg | INTRAMUSCULAR | Status: DC | PRN
  Administered 2022-01-23 (×2): 0.5 mg via INTRAVENOUS
  Filled 2022-01-23 (×2): qty 0.5

## 2022-01-23 MED ORDER — SCOPOLAMINE 1 MG/3DAYS TD PT72
MEDICATED_PATCH | TRANSDERMAL | Status: AC
  Filled 2022-01-23: qty 1

## 2022-01-23 MED ORDER — FENTANYL CITRATE (PF) 250 MCG/5ML IJ SOLN
INTRAMUSCULAR | Status: AC
  Filled 2022-01-23: qty 5

## 2022-01-23 MED ORDER — ROCURONIUM BROMIDE 10 MG/ML (PF) SYRINGE
PREFILLED_SYRINGE | INTRAVENOUS | Status: DC | PRN
  Administered 2022-01-23: 60 mg via INTRAVENOUS

## 2022-01-23 MED ORDER — ONDANSETRON HCL 4 MG/2ML IJ SOLN
INTRAMUSCULAR | Status: AC
  Filled 2022-01-23: qty 2

## 2022-01-23 MED ORDER — FENTANYL CITRATE (PF) 250 MCG/5ML IJ SOLN
INTRAMUSCULAR | Status: DC | PRN
  Administered 2022-01-23 (×2): 50 ug via INTRAVENOUS
  Administered 2022-01-23: 100 ug via INTRAVENOUS
  Administered 2022-01-23: 50 ug via INTRAVENOUS

## 2022-01-23 MED ORDER — DOCUSATE SODIUM 100 MG PO CAPS: 100.0000 mg | ORAL_CAPSULE | Freq: Two times a day (BID) | ORAL | 0 refills | Status: AC

## 2022-01-23 MED ORDER — BUPIVACAINE HCL (PF) 0.5 % IJ SOLN
INTRAMUSCULAR | Status: DC | PRN
  Administered 2022-01-23: 10 mL

## 2022-01-23 MED ORDER — IBUPROFEN 200 MG PO TABS: 200.0000 mg | ORAL_TABLET | Freq: Four times a day (QID) | ORAL | 0 refills | Status: AC

## 2022-01-23 MED ORDER — SODIUM CHLORIDE 0.9 % IV SOLN
INTRAVENOUS | Status: AC
  Filled 2022-01-23: qty 2

## 2022-01-23 MED ORDER — DEXMEDETOMIDINE (PRECEDEX) IN NS 20 MCG/5ML (4 MCG/ML) IV SYRINGE
PREFILLED_SYRINGE | INTRAVENOUS | Status: DC | PRN
  Administered 2022-01-23: 8 ug via INTRAVENOUS

## 2022-01-23 MED ORDER — SCOPOLAMINE 1 MG/3DAYS TD PT72
1.0000 | MEDICATED_PATCH | Freq: Once | TRANSDERMAL | Status: DC
  Administered 2022-01-23: 1.5 mg via TRANSDERMAL

## 2022-01-23 MED ORDER — ONDANSETRON HCL 4 MG/2ML IJ SOLN
4.0000 mg | Freq: Once | INTRAMUSCULAR | Status: AC | PRN
  Administered 2022-01-23: 4 mg via INTRAVENOUS
  Filled 2022-01-23: qty 2

## 2022-01-23 MED ORDER — ACETAMINOPHEN 325 MG PO TABS: 650.0000 mg | ORAL_TABLET | Freq: Four times a day (QID) | ORAL | 0 refills | Status: AC

## 2022-01-23 MED ORDER — CHLORHEXIDINE GLUCONATE 0.12 % MT SOLN: 15.0000 mL | Freq: Once | OROMUCOSAL | Status: DC

## 2022-01-23 MED ORDER — CHLORHEXIDINE GLUCONATE 0.12 % MT SOLN
OROMUCOSAL | Status: AC
  Administered 2022-01-23: 15 mL
  Filled 2022-01-23: qty 15

## 2022-01-23 MED ORDER — DEXMEDETOMIDINE (PRECEDEX) IN NS 20 MCG/5ML (4 MCG/ML) IV SYRINGE
PREFILLED_SYRINGE | INTRAVENOUS | Status: AC
  Filled 2022-01-23: qty 10

## 2022-01-23 MED ORDER — 0.9 % SODIUM CHLORIDE (POUR BTL) OPTIME
TOPICAL | Status: DC | PRN
  Administered 2022-01-23: 1000 mL

## 2022-01-23 MED ORDER — SODIUM CHLORIDE 0.9 % IV SOLN
2.0000 g | INTRAVENOUS | Status: AC
  Administered 2022-01-23: 2 g via INTRAVENOUS

## 2022-01-23 MED ORDER — DEXAMETHASONE SODIUM PHOSPHATE 10 MG/ML IJ SOLN
INTRAMUSCULAR | Status: DC | PRN
Start: 2022-01-23 — End: 2022-01-23
  Administered 2022-01-23: 10 mg via INTRAVENOUS

## 2022-01-23 MED ORDER — BUPIVACAINE HCL (PF) 0.5 % IJ SOLN
INTRAMUSCULAR | Status: AC
Start: 2022-01-23 — End: ?
  Filled 2022-01-23: qty 30

## 2022-01-23 MED ORDER — LIDOCAINE 2% (20 MG/ML) 5 ML SYRINGE
INTRAMUSCULAR | Status: DC | PRN
  Administered 2022-01-23: 80 mg via INTRAVENOUS

## 2022-01-23 SURGICAL SUPPLY — 41 items
ADH SKN CLS APL DERMABOND .7 (GAUZE/BANDAGES/DRESSINGS) ×1
APL PRP STRL LF DISP 70% ISPRP (MISCELLANEOUS) ×1
APPLIER CLIP ROT 10 11.4 M/L (STAPLE) ×2
APR CLP MED LRG 11.4X10 (STAPLE) ×1
BAG RETRIEVAL 10 (BASKET) ×1
BLADE SURG 15 STRL LF DISP TIS (BLADE) ×1 IMPLANT
BLADE SURG 15 STRL SS (BLADE) ×2
CHLORAPREP W/TINT 26 (MISCELLANEOUS) ×2 IMPLANT
CLIP APPLIE ROT 10 11.4 M/L (STAPLE) ×1 IMPLANT
CLOTH BEACON ORANGE TIMEOUT ST (SAFETY) ×2 IMPLANT
COVER LIGHT HANDLE STERIS (MISCELLANEOUS) ×4 IMPLANT
DECANTER SPIKE VIAL GLASS SM (MISCELLANEOUS) ×2 IMPLANT
DERMABOND ADVANCED (GAUZE/BANDAGES/DRESSINGS) ×1
DERMABOND ADVANCED .7 DNX12 (GAUZE/BANDAGES/DRESSINGS) ×1 IMPLANT
ELECT REM PT RETURN 9FT ADLT (ELECTROSURGICAL) ×2
ELECTRODE REM PT RTRN 9FT ADLT (ELECTROSURGICAL) ×1 IMPLANT
GAUZE 4X4 16PLY ~~LOC~~+RFID DBL (SPONGE) ×2 IMPLANT
GLOVE SURG ENC MOIS LTX SZ6.5 (GLOVE) ×2 IMPLANT
GLOVE SURG UNDER POLY LF SZ7 (GLOVE) ×8 IMPLANT
GOWN STRL REUS W/TWL LRG LVL3 (GOWN DISPOSABLE) ×6 IMPLANT
HEMOSTAT SNOW SURGICEL 2X4 (HEMOSTASIS) ×2 IMPLANT
INST SET LAPROSCOPIC AP (KITS) ×2 IMPLANT
KIT TURNOVER KIT A (KITS) ×2 IMPLANT
MANIFOLD NEPTUNE II (INSTRUMENTS) ×2 IMPLANT
NDL INSUFFLATION 14GA 120MM (NEEDLE) ×1 IMPLANT
NEEDLE INSUFFLATION 14GA 120MM (NEEDLE) ×2 IMPLANT
NS IRRIG 1000ML POUR BTL (IV SOLUTION) ×2 IMPLANT
PACK LAP CHOLE LZT030E (CUSTOM PROCEDURE TRAY) ×2 IMPLANT
PAD ARMBOARD 7.5X6 YLW CONV (MISCELLANEOUS) ×2 IMPLANT
SET BASIN LINEN APH (SET/KITS/TRAYS/PACK) ×2 IMPLANT
SET TUBE SMOKE EVAC HIGH FLOW (TUBING) ×2 IMPLANT
SLEEVE ENDOPATH XCEL 5M (ENDOMECHANICALS) ×2 IMPLANT
SUT MNCRL AB 4-0 PS2 18 (SUTURE) ×4 IMPLANT
SUT VICRYL 0 UR6 27IN ABS (SUTURE) ×2 IMPLANT
SYS BAG RETRIEVAL 10MM (BASKET) ×1
SYSTEM BAG RETRIEVAL 10MM (BASKET) ×1 IMPLANT
TROCAR ENDO BLADELESS 11MM (ENDOMECHANICALS) ×2 IMPLANT
TROCAR XCEL NON-BLD 5MMX100MML (ENDOMECHANICALS) ×2 IMPLANT
TROCAR XCEL UNIV SLVE 11M 100M (ENDOMECHANICALS) ×2 IMPLANT
TUBE CONNECTING 12X1/4 (SUCTIONS) ×2 IMPLANT
WARMER LAPAROSCOPE (MISCELLANEOUS) ×2 IMPLANT

## 2022-01-23 NOTE — Op Note (Signed)
Operative Note   Preoperative Diagnosis: Symptomatic cholelithiasis   Postoperative Diagnosis: Same   Procedure(s) Performed: Laparoscopic cholecystectomy   Surgeon: Graciella Freer, DO    Assistants: Aviva Signs, MD   Anesthesia: General endotracheal   Anesthesiologist: Denese Killings, MD    Specimens: Gallbladder    Estimated Blood Loss: Minimal    Blood Replacement: None    Complications: None    Operative Findings: Minimally inflamed gallbladder   Procedure: The patient was taken to the operating room and placed supine. General endotracheal anesthesia was induced. Intravenous antibiotics were administered per protocol. An orogastric tube positioned to decompress the stomach. The abdomen was prepared and draped in the usual sterile fashion.    A supraumbilical incision was made and a Veress technique was utilized to achieve pneumoperitoneum to 15 mmHg with carbon dioxide. A 11 mm optiview port was placed through the supraumbilical region, and a 10 mm 0-degree operative laparoscope was introduced. The area underlying the trocar and Veress needle were inspected and without evidence of injury.  Remaining trocars were placed under direct vision. Two 5 mm ports were placed in the right abdomen, between the anterior axillary and midclavicular line.  A final 11 mm port was placed through the mid-epigastrium, near the falciform ligament.    The gallbladder fundus was elevated cephalad and the infundibulum was retracted to the patient's right. The gallbladder/cystic duct junction was skeletonized. The cystic artery noted in the triangle of Calot and was also skeletonized.  We then continued liberal medial and lateral dissection until the critical view of safety was achieved.    The cystic duct and cystic artery were doubly clipped and divided. The gallbladder was then dissected from the liver bed with electrocautery. The specimen was placed in an Endopouch and was retrieved  through the epigastric site.   Final inspection revealed acceptable hemostasis. Surgical SNOW was placed in the gallbladder bed.  Trocars were removed and pneumoperitoneum was released.  0 Vicryl fascial sutures were used to close the epigastric and umbilical port sites. Skin incisions were closed with 4-0 Monocryl subcuticular sutures and Dermabond. The patient was awakened from anesthesia and extubated without complication.    Graciella Freer, DO  [facility] [address] [place], [place] [address] [phone] (office)

## 2022-01-23 NOTE — Discharge Instructions (Signed)
Ambulatory Surgery Discharge Instructions  General Anesthesia or Sedation Do not drive or operate heavy machinery for 24 hours.  Do not consume alcohol, tranquilizers, sleeping medications, or any non-prescribed medications for 24 hours. Do not make important decisions or sign any important papers in the next 24 hours. You should have someone with you tonight at home.  Activity  You are advised to go directly home from the hospital.  Restrict your activities and rest for a day.  Resume light activity tomorrow. No heavy lifting over 10 lbs or strenuous exercise.  Fluids and Diet Begin with clear liquids, bouillon, dry toast, soda crackers.  If not nauseated, you may go to a regular diet when you desire.  Greasy and spicy foods are not advised.  Medications  If you have not had a bowel movement in 24 hours, take 2 tablespoons over the counter Milk of mag.             You May resume your blood thinners tomorrow (Aspirin, coumadin, or other).  You are being discharged with prescriptions for Opioid/Narcotic Medications: There are some specific considerations for these medications that you should know. Opioid Meds have risks & benefits. Addiction to these meds is always a concern with prolonged use Take medication only as directed Do not drive while taking narcotic pain medication Do not crush tablets or capsules Do not use a different container than medication was dispensed in Lock the container of medication in a cool, dry place out of reach of children and pets. Opioid medication can cause addiction Do not share with anyone else (this is a felony) Do not store medications for future use. Dispose of them properly.     Disposal:  Find a [place] household drug take back site near you. For a list of sites, visit [employer]. If you can't get to a drug take back site, use the recipe below as a last resort to dispose of expired, unused or unwanted drugs. Disposal  (Do not  dispose chemotherapy drugs this way, talk to your prescribing doctor instead.) Step 1: Mix drugs (do not crush) with dirt, kitty litter, or used coffee grounds and add a small amount of water to dissolve any solid medications. Step 2: Seal drugs in plastic bag. Step 3: Place plastic bag in trash. Step 4: Take prescription container and scratch out personal information, then recycle or throw away.  Operative Site  You have a liquid bandage over your incisions, this will begin to flake off in about a week. Ok to English as a second language teacher. Keep wound clean and dry. No baths or swimming. No lifting more than 10 pounds.  Contact Information: If you have questions or concerns, please call our office, [phone], Monday- Thursday 8AM-5PM and Friday 8AM-12Noon.  If it is after hours or on the weekend, please call [facility] Main Number, [phone], and ask to speak to the surgeon on call for Dr. Robyne Peers at [facility].   SPECIFIC COMPLICATIONS TO WATCH FOR: Inability to urinate Fever over 101? F by mouth Nausea and vomiting lasting longer than 24 hours. Pain not relieved by medication ordered Swelling around the operative site Increased redness, warmth, hardness, around operative area Numbness, tingling, or cold fingers or toes Blood -soaked dressing, (small amounts of oozing may be normal) Increasing and progressive drainage from surgical area or exam site

## 2022-01-23 NOTE — Progress Notes (Signed)
Called to update the patient's mother.  It was explained that she did well during the surgery, and that she will be able to go home once she has completed her recovery in the PACU.  All questions were answered to her expressed satisfaction.  Theophilus Kinds, DO [facility] [address] [place], [place] [address] [phone] (office)

## 2022-01-23 NOTE — Transfer of Care (Signed)
Immediate Anesthesia Transfer of Care Note  Patient: Payslee Eakle  Procedure(s) Performed: LAPAROSCOPIC CHOLECYSTECTOMY (Abdomen)  Patient Location: PACU  Anesthesia Type:General  Level of Consciousness: awake, alert , oriented and patient cooperative  Airway & Oxygen Therapy: Patient Spontanous Breathing and Patient connected to nasal cannula oxygen  Post-op Assessment: Report given to RN, Post -op Vital signs reviewed and stable and Patient moving all extremities  Post vital signs: Reviewed and stable  Last Vitals:  Vitals Value Taken Time  BP 114/81 01/23/22 1245  Temp    Pulse 96 01/23/22 1247  Resp 21 01/23/22 1247  SpO2 96 % 01/23/22 1247  Vitals shown include unvalidated device data.  Last Pain:  Vitals:   01/23/22 1054  TempSrc: Oral  PainSc: 0-No pain      Patients Stated Pain Goal: 6 (01/23/22 1054)  Complications: No notable events documented.

## 2022-01-23 NOTE — Anesthesia Procedure Notes (Signed)
Procedure Name: Intubation Date/Time: 01/23/2022 11:45 AM Performed by: Myna Bright, CRNA Pre-anesthesia Checklist: Patient identified, Emergency Drugs available, Suction available and Patient being monitored Patient Re-evaluated:Patient Re-evaluated prior to induction Oxygen Delivery Method: Circle system utilized Preoxygenation: Pre-oxygenation with 100% oxygen Induction Type: IV induction Ventilation: Mask ventilation without difficulty Laryngoscope Size: Mac and 3 Grade View: Grade I Tube type: Oral Tube size: 7.0 mm Number of attempts: 1 Airway Equipment and Method: Stylet Placement Confirmation: ETT inserted through vocal cords under direct vision, positive ETCO2 and breath sounds checked- equal and bilateral Secured at: 21 cm Tube secured with: Tape Dental Injury: Teeth and Oropharynx as per pre-operative assessment

## 2022-01-23 NOTE — Interval H&P Note (Signed)
History and Physical Interval Note:  01/23/2022 11:12 AM  [name]  has presented today for surgery, with the diagnosis of acute cholelithiasis.  The various methods of treatment have been discussed with the patient and family. After consideration of risks, benefits and other options for treatment, the patient has consented to  Procedure(s): LAPAROSCOPIC CHOLECYSTECTOMY (N/A) as a surgical intervention.  The patient's history has been reviewed, patient examined, no change in status, stable for surgery.  I have reviewed the patient's chart and labs.  Questions were answered to the patient's satisfaction.     [place]

## 2022-01-23 NOTE — Anesthesia Postprocedure Evaluation (Signed)
Anesthesia Post Note  Patient: [name]  Procedure(s) Performed: LAPAROSCOPIC CHOLECYSTECTOMY (Abdomen)  Patient location during evaluation: Phase II Anesthesia Type: General Level of consciousness: awake and alert and oriented Pain management: pain level controlled Vital Signs Assessment: post-procedure vital signs reviewed and stable Respiratory status: spontaneous breathing, nonlabored ventilation and respiratory function stable Cardiovascular status: blood pressure returned to baseline and stable Postop Assessment: no apparent nausea or vomiting Anesthetic complications: no   No notable events documented.   Last Vitals:  Vitals:   01/23/22 1330 01/23/22 1353  BP: 118/79 120/78  Pulse: 79 87  Resp: 15 13  Temp:  36.5 C  SpO2: 97% 96%    Last Pain:  Vitals:   01/23/22 1353  TempSrc: Oral  PainSc: 5                  Taima Rada C Takeem Krotzer

## 2022-01-23 NOTE — Anesthesia Preprocedure Evaluation (Signed)
Anesthesia Evaluation  Patient identified by MRN, date of birth, ID band Patient awake    Reviewed: Allergy & Precautions, NPO status , Patient's Chart, lab work & pertinent test results  Airway Mallampati: I  TM Distance: >3 FB Neck ROM: Full    Dental  (+) Dental Advisory Given, Missing   Pulmonary neg pulmonary ROS,    Pulmonary exam normal breath sounds clear to auscultation       Cardiovascular Exercise Tolerance: Good negative cardio ROS Normal cardiovascular exam Rhythm:Regular Rate:Normal     Neuro/Psych negative neurological ROS  negative psych ROS   GI/Hepatic negative GI ROS, Cholelithiasis/cholecystitis    Endo/Other  negative endocrine ROS  Renal/GU negative Renal ROS  negative genitourinary   Musculoskeletal negative musculoskeletal ROS (+)   Abdominal   Peds negative pediatric ROS (+)  Hematology negative hematology ROS (+)   Anesthesia Other Findings   Reproductive/Obstetrics negative OB ROS                            Anesthesia Physical Anesthesia Plan  ASA: 2  Anesthesia Plan: General   Post-op Pain Management: Dilaudid IV   Induction: Intravenous  PONV Risk Score and Plan: 4 or greater and Ondansetron, Dexamethasone, Midazolam and Scopolamine patch - Pre-op  Airway Management Planned: Oral ETT  Additional Equipment:   Intra-op Plan:   Post-operative Plan: Extubation in OR  Informed Consent: I have reviewed the patients History and Physical, chart, labs and discussed the procedure including the risks, benefits and alternatives for the proposed anesthesia with the patient or authorized representative who has indicated his/her understanding and acceptance.     Dental advisory given  Plan Discussed with: CRNA and Surgeon  Anesthesia Plan Comments:        Anesthesia Quick Evaluation

## 2022-01-26 LAB — SURGICAL PATHOLOGY

## 2022-01-27 ENCOUNTER — Encounter (HOSPITAL_COMMUNITY): Payer: Self-pay | Admitting: Surgery

## 2022-02-10 ENCOUNTER — Ambulatory Visit (INDEPENDENT_AMBULATORY_CARE_PROVIDER_SITE_OTHER): Payer: 59 | Admitting: Surgery

## 2022-02-10 DIAGNOSIS — Z09 Encounter for follow-up examination after completed treatment for conditions other than malignant neoplasm: Secondary | ICD-10-CM

## 2022-02-10 NOTE — Progress Notes (Signed)
[facility] ? ?I am calling the patient for post operative evaluation. This is not a billable encounter as it is under the global charges for the surgery.  The patient had a laparoscopic cholecystectomy on 2/17. The patient reports that she is doing well.  Her pain is adequately controlled with Tylenol and Motrin, she is tolerating her diet without nausea and vomiting, and she is having regular bowel movements.  The incisions are healing well without redness or drainage.  The skin glue has flaked off the patient has no concerns.  ? ?Pathology: ?A. GALLBLADDER, CHOLECYSTECTOMY:  ?    - Chronic cholecystitis.  ?    - Cholelithiasis. ? ?Will see the patient PRN.  ? ?Theophilus Kinds, DO ?[facility] ?[address] [place] [address] ?[place], [place] [address] ?[phone] (office) ? ?

## 2022-04-21 ENCOUNTER — Other Ambulatory Visit: Payer: 59 | Admitting: Adult Health

## 2022-05-19 ENCOUNTER — Ambulatory Visit (INDEPENDENT_AMBULATORY_CARE_PROVIDER_SITE_OTHER): Payer: 59 | Admitting: Adult Health

## 2022-05-19 ENCOUNTER — Encounter: Payer: Self-pay | Admitting: Adult Health

## 2022-05-19 ENCOUNTER — Other Ambulatory Visit (HOSPITAL_COMMUNITY)
Admission: RE | Admit: 2022-05-19 | Discharge: 2022-05-19 | Disposition: A | Discharge status: Home / Self Care | Payer: 59 | Source: Ambulatory Visit | Attending: Adult Health | Admitting: Adult Health

## 2022-05-19 VITALS — BP 121/81 | HR 84 | Ht 66.5 in | Wt 228.0 lb

## 2022-05-19 DIAGNOSIS — N898 Other specified noninflammatory disorders of vagina: Secondary | ICD-10-CM | POA: Insufficient documentation

## 2022-05-19 DIAGNOSIS — R350 Frequency of micturition: Secondary | ICD-10-CM

## 2022-05-19 DIAGNOSIS — Z113 Encounter for screening for infections with a predominantly sexual mode of transmission: Secondary | ICD-10-CM | POA: Diagnosis not present

## 2022-05-19 DIAGNOSIS — Z01419 Encounter for gynecological examination (general) (routine) without abnormal findings: Secondary | ICD-10-CM | POA: Diagnosis not present

## 2022-05-19 LAB — POCT URINALYSIS DIPSTICK
Glucose, UA: NEGATIVE
Ketones, UA: NEGATIVE
Nitrite, UA: NEGATIVE
Protein, UA: NEGATIVE

## 2022-05-19 MED ORDER — SULFAMETHOXAZOLE-TRIMETHOPRIM 800-160 MG PO TABS: 1.0000 | ORAL_TABLET | Freq: Two times a day (BID) | ORAL | 0 refills | Status: DC

## 2022-05-19 NOTE — Progress Notes (Signed)
Patient ID: [name], female   DOB: December 16, 1990, 31 y.o.   MRN: [id number] History of Present Illness: [name] is a 31 year old black female,single, [id number], in for a well woman gyn exam. She has had urinary frequency for about a week. Periods irregular at times, may skip.  Lab Results  Component Value Date   DIAGPAP  04/08/2021    - Negative for intraepithelial lesion or malignancy (NILM)   HPVHIGH Negative 04/08/2021    PCP is T Passmore NP  Current Medications, Allergies, Past Medical History, Past Surgical History, Family History and Social History were reviewed in [employer] record.     Review of Systems: Patient denies any headaches, hearing loss, fatigue, blurred vision, shortness of breath, chest pain, abdominal pain, problems with bowel movements, or intercourse. No joint pain or mood swings.  Urinary frequency for 1 week  Periods irregular at times, may skip   Physical Exam:BP 121/81 (BP Location: Left Arm, Patient Position: Sitting, Cuff Size: Large)   Pulse 84   Ht 5' 6.5" (1.689 m)   Wt 228 lb (103.4 kg)   BMI 36.25 kg/m  urine dipstick 2+blood and 2+leuks General:  Well developed, well nourished, no acute distress Skin:  Warm and dry Neck:  Midline trachea, normal thyroid, good ROM, no lymphadenopathy Lungs; Clear to auscultation bilaterally Breast:  No dominant palpable mass, retraction, or nipple discharge Cardiovascular: Regular rate and rhythm Abdomen:  Soft, non tender, no hepatosplenomegaly Pelvic:  External genitalia is normal in appearance, no lesions.  The vagina is normal in appearance. Has creamy white discharge. Urethra has no lesions or masses. The cervix is bulbous.  Uterus is felt to be normal size, shape, and contour.  No adnexal masses or tenderness noted.Bladder is non tender, no masses felt.CV swab obtained. Extremities/musculoskeletal:  No swelling or varicosities noted, no clubbing or cyanosis Psych:  No mood changes,  alert and cooperative,seems happy AA is 1 Fall risk is low    05/19/2022    9:36 AM 08/20/2021    9:31 AM 04/08/2021    9:28 AM  Depression screen PHQ 2/9  Decreased Interest 0 0 0  Down, Depressed, Hopeless 0 0 0  PHQ - 2 Score 0 0 0  Altered sleeping 0  0  Tired, decreased energy 1  0  Change in appetite 0  0  Feeling bad or failure about yourself  0  0  Trouble concentrating 0  0  Moving slowly or fidgety/restless 0  0  Suicidal thoughts 0  0  PHQ-9 Score 1  0       05/19/2022    9:36 AM 04/08/2021    9:28 AM  GAD 7 : Generalized Anxiety Score  Nervous, Anxious, on Edge 0 0  Control/stop worrying 0 0  Worry too much - different things 0 0  Trouble relaxing 0 0  Restless 0 0  Easily annoyed or irritable 1 0  Afraid - awful might happen 0 0  Total GAD 7 Score 1 0    Upstream - 05/19/22 0955       Pregnancy Intention Screening   Does the patient want to become pregnant in the next year? No    Does the patient's partner want to become pregnant in the next year? No    Would the patient like to discuss contraceptive options today? No      Contraception Wrap Up   Current Method Female Condom    End Method Female Condom  Examination chaperoned by Malachy Mood LPN    Impression and Plan: 1. Urinary frequency UA C&S sent Push fluids and do not hold pee Will rx septra ds   Meds ordered this encounter  Medications   sulfamethoxazole-trimethoprim (BACTRIM DS) 800-160 MG tablet    Sig: Take 1 tablet by mouth 2 (two) times daily. Take 1 bid    Dispense:  14 tablet    Refill:  0    Order Specific Question:   Supervising Provider    Answer:   Despina Hidden, LUTHER H [[id number]]     2. Encounter for well woman exam with routine gynecological exam Physical in 1 year Pap in 2025   3. Screening examination for STD (sexually transmitted disease) CV swab sent for GC/HCl,trich, BV and yeast  Check HIV,RPR and Hepatitis B and C  4. Vaginal discharge CV swab sent

## 2022-05-20 ENCOUNTER — Other Ambulatory Visit: Payer: Self-pay | Admitting: Adult Health

## 2022-05-20 LAB — CERVICOVAGINAL ANCILLARY ONLY
Bacterial Vaginitis (gardnerella): POSITIVE — AB
Candida Glabrata: NEGATIVE
Candida Vaginitis: NEGATIVE
Chlamydia: NEGATIVE
Comment: NEGATIVE
Comment: NEGATIVE
Comment: NEGATIVE
Comment: NEGATIVE
Comment: NEGATIVE
Comment: NORMAL
Neisseria Gonorrhea: NEGATIVE
Trichomonas: NEGATIVE

## 2022-05-20 LAB — URINALYSIS, ROUTINE W REFLEX MICROSCOPIC
Bilirubin, UA: NEGATIVE
Glucose, UA: NEGATIVE
Ketones, UA: NEGATIVE
Nitrite, UA: NEGATIVE
Protein,UA: NEGATIVE
Specific Gravity, UA: 1.011 (ref 1.005–1.030)
Urobilinogen, Ur: 0.2 mg/dL (ref 0.2–1.0)
pH, UA: 6.5 (ref 5.0–7.5)

## 2022-05-20 LAB — HEPATITIS B SURFACE ANTIGEN: Hepatitis B Surface Ag: NEGATIVE

## 2022-05-20 LAB — RPR: RPR Ser Ql: NONREACTIVE

## 2022-05-20 LAB — MICROSCOPIC EXAMINATION
Bacteria, UA: NONE SEEN
Casts: NONE SEEN /lpf
WBC, UA: >30 /hpf — AB (ref 0–5)

## 2022-05-20 LAB — HEPATITIS C ANTIBODY: Hep C Virus Ab: NONREACTIVE

## 2022-05-20 LAB — HIV ANTIBODY (ROUTINE TESTING W REFLEX): HIV Screen 4th Generation wRfx: NONREACTIVE

## 2022-05-20 MED ORDER — METRONIDAZOLE 500 MG PO TABS
500.0000 mg | ORAL_TABLET | Freq: Two times a day (BID) | ORAL | 0 refills | Status: DC
Start: 2022-05-20 — End: 2023-02-22

## 2022-05-20 NOTE — Progress Notes (Signed)
+  BV on vaginal swab will rx flagyl 

## 2022-05-22 LAB — URINE CULTURE

## 2023-02-22 ENCOUNTER — Encounter: Payer: Self-pay | Admitting: Women's Health

## 2023-02-22 ENCOUNTER — Ambulatory Visit (INDEPENDENT_AMBULATORY_CARE_PROVIDER_SITE_OTHER): Payer: 59 | Admitting: Women's Health

## 2023-02-22 ENCOUNTER — Other Ambulatory Visit (HOSPITAL_COMMUNITY)
Admission: RE | Admit: 2023-02-22 | Discharge: 2023-02-22 | Disposition: A | Discharge status: Home / Self Care | Payer: 59 | Source: Ambulatory Visit | Attending: Women's Health | Admitting: Women's Health

## 2023-02-22 VITALS — BP 109/71 | HR 91 | Ht 65.0 in | Wt 223.8 lb

## 2023-02-22 DIAGNOSIS — Z113 Encounter for screening for infections with a predominantly sexual mode of transmission: Secondary | ICD-10-CM | POA: Insufficient documentation

## 2023-02-22 DIAGNOSIS — Z3202 Encounter for pregnancy test, result negative: Secondary | ICD-10-CM

## 2023-02-22 DIAGNOSIS — Z30011 Encounter for initial prescription of contraceptive pills: Secondary | ICD-10-CM

## 2023-02-22 DIAGNOSIS — R3915 Urgency of urination: Secondary | ICD-10-CM

## 2023-02-22 LAB — POCT URINALYSIS DIPSTICK OB
Blood, UA: NEGATIVE
Glucose, UA: NEGATIVE
Ketones, UA: NEGATIVE
Nitrite, UA: NEGATIVE
POC,PROTEIN,UA: NEGATIVE

## 2023-02-22 LAB — POCT URINE PREGNANCY: Preg Test, Ur: NEGATIVE

## 2023-02-22 MED ORDER — NITROFURANTOIN MONOHYD MACRO 100 MG PO CAPS: 100.0000 mg | ORAL_CAPSULE | Freq: Two times a day (BID) | ORAL | 0 refills | Status: AC

## 2023-02-22 MED ORDER — NORETHINDRONE 0.35 MG PO TABS: 1.0000 | ORAL_TABLET | Freq: Every day | ORAL | 3 refills | Status: AC

## 2023-02-22 NOTE — Progress Notes (Signed)
GYN VISIT Patient name: [name] MRN [id number]  Date of birth: 05-16-1991 Chief Complaint:   Contraception (Also did self swab)  History of Present Illness:   [name] is a 32 y.o. [phone] African-American female being seen today for STD screen, urinary urgency and hesitancy x 1wk- feels like when she had UTI, and is trying to work things out w/ ex-husband, so would like birth control. Has h/o PE. Discussed progestin-only methods, wants POPs (micronor specifically).     Patient's last menstrual period was 02/01/2023. The current method of family planning is none.  Last pap 04/08/21. Results were: NILM w/ HRHPV negative     05/19/2022    9:36 AM 08/20/2021    9:31 AM 04/08/2021    9:28 AM 05/03/2018    3:56 PM 07/29/2016    2:43 PM  Depression screen PHQ 2/9  Decreased Interest 0 0 0 0 0  Down, Depressed, Hopeless 0 0 0 0 0  PHQ - 2 Score 0 0 0 0 0  Altered sleeping 0  0 1 0  Tired, decreased energy 1  0 1 1  Change in appetite 0  0 0 0  Feeling bad or failure about yourself  0  0 0 0  Trouble concentrating 0  0 0 0  Moving slowly or fidgety/restless 0  0 0 0  Suicidal thoughts 0  0 0 0  PHQ-9 Score 1  0 2 1  Difficult doing work/chores    Not difficult at all         05/19/2022    9:36 AM 04/08/2021    9:28 AM  GAD 7 : Generalized Anxiety Score  Nervous, Anxious, on Edge 0 0  Control/stop worrying 0 0  Worry too much - different things 0 0  Trouble relaxing 0 0  Restless 0 0  Easily annoyed or irritable 1 0  Afraid - awful might happen 0 0  Total GAD 7 Score 1 0     Review of Systems:   Pertinent items are noted in HPI Denies fever/chills, dizziness, headaches, visual disturbances, fatigue, shortness of breath, chest pain, abdominal pain, vomiting, abnormal vaginal discharge/itching/odor/irritation, problems with periods, bowel movements, urination, or intercourse unless otherwise stated above.  Pertinent History Reviewed:  Reviewed past medical,surgical,  social, obstetrical and family history.  Reviewed problem list, medications and allergies. Physical Assessment:   Vitals:   02/22/23 1619  BP: 109/71  Pulse: 91  Weight: 223 lb 12.8 oz (101.5 kg)  Height: 5\' 5"  (1.651 m)  Body mass index is 37.24 kg/m.       Physical Examination:   General appearance: alert, well appearing, and in no distress  Mental status: alert, oriented to person, place, and time  Skin: warm & dry   Cardiovascular: normal heart rate noted  Respiratory: normal respiratory effort, no distress  Abdomen: soft, non-tender   Pelvic: examination not indicated  Extremities: no edema   Chaperone: N/A    Results for orders placed or performed in visit on 02/22/23 (from the past 24 hour(s))  POCT urine pregnancy   Collection Time: 02/22/23  4:18 PM  Result Value Ref Range   Preg Test, Ur Negative Negative  POC Urinalysis Dipstick OB   Collection Time: 02/22/23  4:19 PM  Result Value Ref Range   Color, UA     Clarity, UA     Glucose, UA Negative Negative   Bilirubin, UA     Ketones, UA neg    Spec Grav,  UA     Blood, UA neg    pH, UA     POC,PROTEIN,UA Negative Negative, Trace, Small (1+), Moderate (2+), Large (3+), 4+   Urobilinogen, UA     Nitrite, UA neg    Leukocytes, UA Trace (A) Negative   Appearance     Odor      Assessment & Plan:  1) STD screen> self-swab per pt request  2) Urinary urgency & hesitancy> feels like when she had UTI in past, requests antibiotics. Rx macrobid, urine cx sent.   3) Contraception Copy (h/o PE), understands has to take at exact same time daily to be effective, if late taking use condom as back-up   Meds:  Meds ordered this encounter  Medications   nitrofurantoin, macrocrystal-monohydrate, (MACROBID) 100 MG capsule    Sig: Take 1 capsule (100 mg total) by mouth 2 (two) times daily. X 7 days    Dispense:  14 capsule    Refill:  0   norethindrone (MICRONOR) 0.35 MG tablet    Sig: Take 1 tablet  (0.35 mg total) by mouth daily.    Dispense:  90 tablet    Refill:  3    Orders Placed This Encounter  Procedures   Urine Culture   POCT urine pregnancy   POC Urinalysis Dipstick OB    Return in about 1 year (around 02/22/2024) for Pap & physical.  Roma Schanz CNM, [facility] 02/22/2023 4:47 PM

## 2023-02-24 LAB — CERVICOVAGINAL ANCILLARY ONLY
Bacterial Vaginitis (gardnerella): NEGATIVE
Candida Glabrata: NEGATIVE
Candida Vaginitis: POSITIVE — AB
Chlamydia: NEGATIVE
Comment: NEGATIVE
Comment: NEGATIVE
Comment: NEGATIVE
Comment: NEGATIVE
Comment: NEGATIVE
Comment: NORMAL
Neisseria Gonorrhea: NEGATIVE
Trichomonas: NEGATIVE

## 2023-02-25 LAB — URINE CULTURE

## 2023-03-01 MED ORDER — FLUCONAZOLE 150 MG PO TABS: 150.0000 mg | ORAL_TABLET | Freq: Once | ORAL | 0 refills | Status: AC

## 2023-03-01 NOTE — Addendum Note (Signed)
Addended by: Roma Schanz on: 03/01/2023 02:08 PM   Modules accepted: Orders

## 2023-06-10 ENCOUNTER — Telehealth: Payer: 59 | Admitting: Physician Assistant

## 2023-06-10 DIAGNOSIS — B001 Herpesviral vesicular dermatitis: Secondary | ICD-10-CM | POA: Diagnosis not present

## 2023-06-10 MED ORDER — VALACYCLOVIR HCL 1 G PO TABS: 1000.0000 mg | ORAL_TABLET | Freq: Two times a day (BID) | ORAL | 0 refills | Status: AC

## 2023-06-10 NOTE — Progress Notes (Signed)
I have spent 5 minutes in review of e-visit questionnaire, review and updating patient chart, medical decision making and response to patient.   Milo Solana Cody Rethel Sebek, PA-C    

## 2023-06-10 NOTE — Progress Notes (Signed)
Message sent to patient requesting further input regarding current symptoms. Awaiting patient response.  

## 2023-06-10 NOTE — Progress Notes (Signed)
We are sorry that you are not feeling well.  Here is how we plan to help!  Based on what you have shared with me it does look like you have a viral infection.    Most cold sores or fever blisters are small fluid filled blisters around the mouth caused by herpes simplex virus.  The most common strain of the virus causing cold sores is herpes simplex virus 1.  It can be spread by skin contact, sharing eating utensils, or even sharing towels.  Cold sores are contagious to other people until dry. (Approximately 5-7 days).  Wash your hands. You can spread the virus to your eyes through handling your contact lenses after touching the lesions.  Most people experience pain at the sight or tingling sensations in their lips that may begin before the ulcers erupt.  Herpes simplex is treatable but not curable.  It may lie dormant for a long time and then reappear due to stress or prolonged sun exposure.  Many patients have success in treating their cold sores with an over the counter topical called Abreva.  You may apply the cream up to 5 times daily (maximum 10 days) until healing occurs.  If you would like to use an oral antiviral medication to speed the healing of your cold sore, I have sent a prescription to your local pharmacy Valacyclovir 1 gm twice daily for 7 days  HOME CARE:  Wash your hands frequently. Do not pick at or rub the sore. Don't open the blisters. Avoid kissing other people during this time. Avoid sharing drinking glasses, eating utensils, or razors. Do not handle contact lenses unless you have thoroughly washed your hands with soap and warm water! Avoid oral sex during this time.  Herpes from sores on your mouth can spread to your partner's genital area. Avoid contact with anyone who has eczema or a weakened immune system. Cold sores are often triggered by exposure to intense sunlight, use a lip balm containing a sunscreen (SPF 30 or higher).  GET HELP RIGHT AWAY IF:  Blisters  look infected. Blisters occur near or in the eye. Symptoms last longer than 10 days. Your symptoms become worse.  MAKE SURE YOU:  Understand these instructions. Will watch your condition. Will get help right away if you are not doing well or get worse.    Your e-visit answers were reviewed by a board certified advanced clinical practitioner to complete your personal care plan.  Depending upon the condition, your plan could have  Included both over the counter or prescription medications.    Please review your pharmacy choice.  Be sure that the pharmacy you have chosen is open so that you can pick up your prescription now.  If there is a problem you can message your provider in MyChart to have the prescription routed to another pharmacy.    Your safety is important to [place].  If you have drug allergies check our prescription carefully.  For the next 24 hours you can use MyChart to ask questions about today's visit, request a non-urgent call back, or ask for a work or school excuse from your e-visit provider.  You will get an email in the next two days asking about your experience.  I hope that your e-visit has been valuable and will speed your recovery.

## 2024-03-08 ENCOUNTER — Encounter: Payer: Self-pay | Admitting: Family Medicine

## 2024-03-08 ENCOUNTER — Ambulatory Visit (INDEPENDENT_AMBULATORY_CARE_PROVIDER_SITE_OTHER): Payer: 59 | Admitting: Family Medicine

## 2024-03-08 VITALS — BP 114/79 | HR 86 | Temp 98.3°F | Resp 16 | Ht 65.5 in | Wt 233.4 lb

## 2024-03-08 DIAGNOSIS — Z0000 Encounter for general adult medical examination without abnormal findings: Secondary | ICD-10-CM | POA: Diagnosis not present

## 2024-03-08 DIAGNOSIS — Z13220 Encounter for screening for lipoid disorders: Secondary | ICD-10-CM

## 2024-03-08 DIAGNOSIS — Z7689 Persons encountering health services in other specified circumstances: Secondary | ICD-10-CM

## 2024-03-08 DIAGNOSIS — Z130 Encounter for screening for diseases of the blood and blood-forming organs and certain disorders involving the immune mechanism: Secondary | ICD-10-CM | POA: Diagnosis not present

## 2024-03-08 DIAGNOSIS — Z1329 Encounter for screening for other suspected endocrine disorder: Secondary | ICD-10-CM | POA: Diagnosis not present

## 2024-03-08 DIAGNOSIS — Z13228 Encounter for screening for other metabolic disorders: Secondary | ICD-10-CM

## 2024-03-09 ENCOUNTER — Encounter: Payer: Self-pay | Admitting: Family Medicine

## 2024-03-09 LAB — CMP14+EGFR
ALT: 23 IU/L (ref 0–32)
AST: 25 IU/L (ref 0–40)
Albumin: 4.7 g/dL (ref 3.9–4.9)
Alkaline Phosphatase: 75 IU/L (ref 44–121)
BUN/Creatinine Ratio: 9 (ref 9–23)
BUN: 8 mg/dL (ref 6–20)
Bilirubin Total: <0.2 mg/dL (ref 0.0–1.2)
CO2: 22 mmol/L (ref 20–29)
Calcium: 9.4 mg/dL (ref 8.7–10.2)
Chloride: 103 mmol/L (ref 96–106)
Creatinine, Ser: 0.85 mg/dL (ref 0.57–1.00)
Globulin, Total: 2.8 g/dL (ref 1.5–4.5)
Glucose: 104 mg/dL — ABNORMAL HIGH (ref 70–99)
Potassium: 3.8 mmol/L (ref 3.5–5.2)
Sodium: 143 mmol/L (ref 134–144)
Total Protein: 7.5 g/dL (ref 6.0–8.5)
eGFR: 93 mL/min/{1.73_m2} (ref >59)

## 2024-03-09 LAB — CBC WITH DIFFERENTIAL/PLATELET
Basophils Absolute: 0 10*3/uL (ref 0.0–0.2)
Basos: 0 %
EOS (ABSOLUTE): 0.1 10*3/uL (ref 0.0–0.4)
Eos: 1 %
Hematocrit: 40.9 % (ref 34.0–46.6)
Hemoglobin: 13.2 g/dL (ref 11.1–15.9)
Immature Grans (Abs): 0 10*3/uL (ref 0.0–0.1)
Immature Granulocytes: 0 %
Lymphocytes Absolute: 2.9 10*3/uL (ref 0.7–3.1)
Lymphs: 38 %
MCH: 26.9 pg (ref 26.6–33.0)
MCHC: 32.3 g/dL (ref 31.5–35.7)
MCV: 84 fL (ref 79–97)
Monocytes Absolute: 0.5 10*3/uL (ref 0.1–0.9)
Monocytes: 7 %
Neutrophils Absolute: 4.2 10*3/uL (ref 1.4–7.0)
Neutrophils: 54 %
Platelets: 347 10*3/uL (ref 150–450)
RBC: 4.9 x10E6/uL (ref 3.77–5.28)
RDW: 15.5 % — ABNORMAL HIGH (ref 11.7–15.4)
WBC: 7.8 10*3/uL (ref 3.4–10.8)

## 2024-03-09 LAB — LIPID PANEL
Chol/HDL Ratio: 5.3 ratio — ABNORMAL HIGH (ref 0.0–4.4)
Cholesterol, Total: 223 mg/dL — ABNORMAL HIGH (ref 100–199)
HDL: 42 mg/dL (ref >39)
LDL Chol Calc (NIH): 151 mg/dL — ABNORMAL HIGH (ref 0–99)
Triglycerides: 166 mg/dL — ABNORMAL HIGH (ref 0–149)
VLDL Cholesterol Cal: 30 mg/dL (ref 5–40)

## 2024-03-09 LAB — TSH: TSH: 0.899 u[IU]/mL (ref 0.450–4.500)

## 2024-03-09 NOTE — Progress Notes (Signed)
 New Patient Office Visit  Subjective    Patient ID: [name], female    DOB: 02-02-1991  Age: 33 y.o. MRN: [id number]  CC:  Chief Complaint  Patient presents with   Establish Care    HPI [name] presents to establish care and for routine annual exam. Patient denies known chronic med issues and denies acute complaints.    Outpatient Encounter Medications as of 03/08/2024  Medication Sig   Multiple Vitamin (MULTIVITAMIN WITH MINERALS) TABS tablet Take 1 tablet by mouth daily.   Probiotic Product (PROBIOTIC PO) Take by mouth.   nitrofurantoin, macrocrystal-monohydrate, (MACROBID) 100 MG capsule Take 1 capsule (100 mg total) by mouth 2 (two) times daily. X 7 days (Patient not taking: Reported on 03/08/2024)   norethindrone (MICRONOR) 0.35 MG tablet Take 1 tablet (0.35 mg total) by mouth daily. (Patient not taking: Reported on 03/08/2024)   Omega-3 Fatty Acids (FISH OIL PO) Take by mouth. (Patient not taking: Reported on 03/08/2024)   VITAMIN D PO Take by mouth. (Patient not taking: Reported on 03/08/2024)   No facility-administered encounter medications on file as of 03/08/2024.    Past Medical History:  Diagnosis Date   Abnormal Pap smear    Chlamydia    Contraceptive management 08/20/2014   Family history of pulmonary embolism 02/08/2013   Father   History of abnormal Pap smear 10/16/2013   History of pulmonary embolus (PE) 08/20/2014   History of trichomoniasis 04/15/2016   HSV-2 seropositive    never had outbreak   Hx of blood clots    PE (pulmonary embolism) 02/08/2013   pulmonary embolus in May 2012 with infarction of the right lower lobe as well as pain in her lower legs presumably the site of origin.    Supervision of high risk pregnancy in first trimester 10/16/2013   On lovenox 120 mg daily in 1 dose   Vaginal Pap smear, abnormal     Past Surgical History:  Procedure Laterality Date   CESAREAN SECTION N/A 10/02/2018   Procedure: CESAREAN SECTION;  Surgeon:  Lazaro Arms, MD;  Location: [facility] BIR[facility]ING SUIT[facility];  Service: Obstetrics;  Laterality: N/A;   CHOLECYSTECTOMY N/A 01/23/2022   Procedure: LAPAROSCOPIC CHOLECYSTECTOMY;  Surgeon: Lewie Chamber, DO;  Location: [facility];  Service: General;  Laterality: N/A;   INDUCED ABORTION  2012,2013   INDUCED ABORTION  02/08/16    Family History  Problem Relation Age of Onset   Diabetes Maternal Grandmother    Hypertension Maternal Grandmother    CAD Maternal Grandmother     Social History   Socioeconomic History   Marital status: Single    Spouse name: Not on file   Number of children: 3   Years of education: Not on file   Highest education level: Not on file  Occupational History   Not on file  Tobacco Use   Smoking status: Never   Smokeless tobacco: Never  Vaping Use   Vaping status: Never Used  Substance and Sexual Activity   Alcohol use: Yes    Comment: wine occ   Drug use: No   Sexual activity: Yes    Birth control/protection: None  Other Topics Concern   Not on file  Social History Narrative   Not on file   Social Drivers of Health   Financial Resource Strain: Low Risk  (03/08/2024)   Overall Financial Resource Strain (CARDIA)    Difficulty of Paying Living Expenses: Not hard at all  Food Insecurity: No Food Insecurity (03/08/2024)  Hunger Vital Sign    Worried About Running Out of Food in the Last Year: Never true    Ran Out of Food in the Last Year: Never true  Transportation Needs: No Transportation Needs (03/08/2024)   PRAPARE - Administrator, Civil Service (Medical): No    Lack of Transportation (Non-Medical): No  Physical Activity: Insufficiently Active (03/08/2024)   Exercise Vital Sign    Days of Exercise per Week: 2 days    Minutes of Exercise per Session: 30 min  Stress: No Stress Concern Present (03/08/2024)   [employer] of Occupational Health - Occupational Stress Questionnaire    Feeling of Stress : Not at all  Social Connections:  Moderately Isolated (03/08/2024)   Social Connection and Isolation Panel [NHANES]    Frequency of Communication with Friends and Family: More than three times a week    Frequency of Social Gatherings with Friends and Family: More than three times a week    Attends Religious Services: Never    Database administrator or Organizations: Yes    Attends Engineer, structural: More than 4 times per year    Marital Status: Divorced  Intimate Partner Violence: Not At Risk (03/08/2024)   Humiliation, Afraid, Rape, and Kick questionnaire    Fear of Current or Ex-Partner: No    Emotionally Abused: No    Physically Abused: No    Sexually Abused: No    Review of Systems  All other systems reviewed and are negative.       Objective   BP 114/79   Pulse 86   Temp 98.3 F (36.8 C) (Oral)   Resp 16   Ht 5' 5.5" (1.664 m)   Wt 233 lb 6.4 oz (105.9 kg)   LMP 02/26/2024   SpO2 96%   BMI 38.25 kg/m   Physical Exam Vitals and nursing note reviewed.  Constitutional:      General: She is not in acute distress.    Appearance: She is obese.  HENT:     Head: Normocephalic and atraumatic.     Right Ear: Tympanic membrane, ear canal and external ear normal.     Left Ear: Tympanic membrane, ear canal and external ear normal.     Nose: Nose normal.     Mouth/Throat:     Mouth: Mucous membranes are moist.     Pharynx: Oropharynx is clear.  Eyes:     Conjunctiva/sclera: Conjunctivae normal.     Pupils: Pupils are equal, round, and reactive to light.  Neck:     Thyroid: No thyromegaly.  Cardiovascular:     Rate and Rhythm: Normal rate and regular rhythm.     Heart sounds: Normal heart sounds. No murmur heard. Pulmonary:     Effort: Pulmonary effort is normal. No respiratory distress.     Breath sounds: Normal breath sounds.  Abdominal:     General: There is no distension.     Palpations: Abdomen is soft. There is no mass.     Tenderness: There is no abdominal tenderness.   Musculoskeletal:        General: Normal range of motion.     Cervical back: Normal range of motion and neck supple.  Skin:    General: Skin is warm and dry.  Neurological:     General: No focal deficit present.     Mental Status: She is alert and oriented to person, place, and time.  Psychiatric:  Mood and Affect: Mood normal.        Behavior: Behavior normal.         Assessment & Plan:   Annual physical exam -     CMP14+EGFR  Screening for deficiency anemia -     CBC with Differential/Platelet  Screening for lipid disorders -     Lipid panel  Screening for endocrine/metabolic/immunity disorders -     TSH  Encounter to establish care     No follow-ups on file.   Tommie Raymond, MD

## 2025-01-04 ENCOUNTER — Encounter: Payer: Self-pay | Admitting: Family Medicine

## 2025-03-08 ENCOUNTER — Encounter: Admitting: Family Medicine
